# Patient Record
Sex: Female | Born: 1990 | Race: White | Hispanic: No | Marital: Married | Smoking: Never smoker
Health system: Southern US, Community
[De-identification: ages and names within clinical notes are randomized; demographics above are authoritative.]

## PROBLEM LIST (undated history)

## (undated) DIAGNOSIS — I493 Ventricular premature depolarization: Secondary | ICD-10-CM

## (undated) HISTORY — PX: WISDOM TOOTH EXTRACTION: SHX21

## (undated) HISTORY — DX: Ventricular premature depolarization: I49.3

---
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
## 2010-05-26 NOTE — Assessment & Plan Note (Signed)
Summary: ? 2nd step TB SHOT- jr  Nurse Visit   Allergies: No Known Drug Allergies  Immunizations Administered:  PPD Skin Test:    Vaccine Type: PPD    Site: right forearm    Mfr: Sanofi Pasteur    Dose: 0.1 ml    Route: ID    Given by: Kathlene November    Exp. Date: 02/06/2011    Lot #: [id number]  Orders Added: 1)  TB Skin Test [86580] 2)  Admin 1st Vaccine [90471]   Appended Document: ? 2nd step TB SHOT- jr   PPD Results    Date of reading: 10/16/2009    Results: < 5mm    Interpretation: negative

## 2010-05-26 NOTE — Miscellaneous (Signed)
Summary: TB SCREENING  TB SCREENING   Imported By: Shelbie Proctor 10/09/2009 16:30:02  _____________________________________________________________________  External Attachment:    Type:   Image     Comment:   External Document

## 2010-07-03 ENCOUNTER — Telehealth: Payer: Self-pay | Admitting: Family Medicine

## 2010-07-07 ENCOUNTER — Ambulatory Visit: Payer: Self-pay | Admitting: Family Medicine

## 2010-07-07 ENCOUNTER — Ambulatory Visit (INDEPENDENT_AMBULATORY_CARE_PROVIDER_SITE_OTHER): Payer: BC Managed Care – PPO | Admitting: Family Medicine

## 2010-07-07 ENCOUNTER — Encounter: Payer: Self-pay | Admitting: Family Medicine

## 2010-07-07 DIAGNOSIS — Z3009 Encounter for other general counseling and advice on contraception: Secondary | ICD-10-CM

## 2010-07-07 NOTE — Progress Notes (Signed)
Summary: ? about visit next week  Phone Note Call from Patient Call back at Home Phone [phone]   Caller: Mom Reason for Call: Talk to Nurse Summary of Call: Pt will be seen on 3/12 for check up to start birth control pills.  Mom would like to know what this visit will entail-?pap and pelvic.  Dr. Linford Arnold, what is your usual procedure for pt's this age.  I do not know if she is sexually active. Initial call taken by: Francee Piccolo CMA Duncan Dull),  July 03, 2010 10:53 AM  Follow-up for Phone Call        WE don't start pap until age 23.  Follow-up by: Nani Gasser MD,  July 03, 2010 1:07 PM  Additional Follow-up for Phone Call Additional follow up Details #1::        Advised pt mom we will not be doing pap, pelvic or breast exam unless Mireille is having problems.  Mom agreeable. Additional Follow-up by: Francee Piccolo CMA (AAMA),  July 03, 2010 1:25 PM

## 2010-07-14 NOTE — Assessment & Plan Note (Signed)
Summary: Discuss Birth Control - jr   Vital Signs:  Patient profile:   20 year old female Height:      66 inches Weight:      121 pounds Pulse rate:   80 / minute BP sitting:   134 / 83  (right arm) Cuff size:   regular  Vitals Entered By: Avon Gully CMA, Duncan Dull) (July 07, 2010 2:01 PM) CC: discuss birth control   Primary Care Provider:  Nani Gasser MD  CC:  discuss birth control.  History of Present Illness:   patient is here today because she plans on becoming sexually active. She is not currently sexually active but she would like to discuss birth control options. She is here with her mother today.  Current Medications (verified): 1)  None  Allergies (verified): No Known Drug Allergies  Comments:  Nurse/Medical Assistant: The patient's medications and allergies were reviewed with the patient and were updated in the Medication and Allergy Lists. Avon Gully CMA, Duncan Dull) (July 07, 2010 2:02 PM)  Physical Exam  General:  Well-developed,well-nourished,in no acute distress; alert,appropriate and cooperative throughout examination Head:  Normocephalic and atraumatic without obvious abnormalities. No apparent alopecia or balding. Lungs:  Normal respiratory effort, chest expands symmetrically. Lungs are clear to auscultation, no crackles or wheezes. Heart:  Normal rate and regular rhythm. S1 and S2 normal without gallop, murmur, click, rub or other extra sounds.   Impression & Recommendations:  Problem # 1:  CONTRACEPTIVE MANAGEMENT (ICD-V25.09)  we spent 20 minutes discussing different options. After thorough discussion she decided on oral contraceptive pills. We discussed the pros and cons and Darral Dash to monitor for any signs of DVT. We will follow her up in one to 2 months to recheck her blood pressure on medication. we also discussed what to do when she misses a pill. I also reviewed the importance of using condoms to prevent STDs and that she should get  tested for gonorrhea and Chlamydia yearly even though we don't start Pap smears until age 36.  Complete Medication List: 1)  Yaz 3-0.02 Mg Tabs (Drospirenone-ethinyl estradiol) .... Take 1 tablet by mouth once a day  Patient Instructions: 1)  Please schedule a follow-up appointment in 1-2  months for blood pressure..  Prescriptions: YAZ 3-0.02 MG TABS (DROSPIRENONE-ETHINYL ESTRADIOL) Take 1 tablet by mouth once a day  #30 x 1   Entered and Authorized by:   Nani Gasser MD   Signed by:   Nani Gasser MD on 07/07/2010   Method used:   Electronically to        [facility] [phone]* (retail)       [address] [place], [place]  [address]       Ph: [phone] or [phone]       Fax: [phone]   RxID:   [id number]    Orders Added: 1)  Est. Patient Level III [36644]

## 2010-08-31 ENCOUNTER — Other Ambulatory Visit: Payer: Self-pay | Admitting: Family Medicine

## 2010-09-07 ENCOUNTER — Encounter: Payer: Self-pay | Admitting: Family Medicine

## 2010-09-08 ENCOUNTER — Ambulatory Visit (INDEPENDENT_AMBULATORY_CARE_PROVIDER_SITE_OTHER): Payer: BC Managed Care – PPO | Admitting: Family Medicine

## 2010-09-08 ENCOUNTER — Encounter: Payer: Self-pay | Admitting: Family Medicine

## 2010-09-08 DIAGNOSIS — Z3009 Encounter for other general counseling and advice on contraception: Secondary | ICD-10-CM

## 2010-09-08 NOTE — Progress Notes (Signed)
  Subjective:    Patient ID: Betty Larson, female    DOB: 09-09-1990, 20 y.o.   MRN: [id number]  HPI Doing well on her OCP.   NO SE She wants to stay with it. No spotting. BP well controlled.  She is sexually active but has been using condoms as well.    Review of Systems     Objective:   Physical Exam  Constitutional: She appears well-developed and well-nourished.  HENT:  Head: Normocephalic and atraumatic.  Cardiovascular: Normal rate, regular rhythm and normal heart sounds.   Pulmonary/Chest: Effort normal and breath sounds normal.          Assessment & Plan:  Contraceptive counseling. She is doing well. Happy with her regimen.  She has refills for the rest of the year. F/U at age 44 for a pap smear.

## 2010-10-20 ENCOUNTER — Other Ambulatory Visit: Payer: Self-pay | Admitting: Family Medicine

## 2010-12-15 ENCOUNTER — Other Ambulatory Visit: Payer: Self-pay | Admitting: Family Medicine

## 2011-02-05 ENCOUNTER — Other Ambulatory Visit: Payer: Self-pay | Admitting: Family Medicine

## 2011-03-29 ENCOUNTER — Other Ambulatory Visit: Payer: Self-pay | Admitting: Family Medicine

## 2011-04-10 ENCOUNTER — Emergency Department
Admission: EM | Admit: 2011-04-10 | Discharge: 2011-04-10 | Disposition: A | Discharge status: Home / Self Care | Payer: BC Managed Care – PPO | Source: Home / Self Care | Attending: Emergency Medicine | Admitting: Emergency Medicine

## 2011-04-10 DIAGNOSIS — J069 Acute upper respiratory infection, unspecified: Secondary | ICD-10-CM

## 2011-04-10 DIAGNOSIS — J029 Acute pharyngitis, unspecified: Secondary | ICD-10-CM

## 2011-04-10 LAB — POCT RAPID STREP A (OFFICE): Rapid Strep A Screen: NEGATIVE

## 2011-04-10 MED ORDER — OSELTAMIVIR PHOSPHATE 75 MG PO CAPS: 75.0000 mg | ORAL_CAPSULE | Freq: Two times a day (BID) | ORAL | Status: AC

## 2011-04-10 MED ORDER — MAGIC MOUTHWASH W/LIDOCAINE: 5.0000 mL | Freq: Four times a day (QID) | ORAL | Status: DC | PRN

## 2011-04-10 NOTE — ED Provider Notes (Signed)
History     CSN: [id number] Arrival date & time: No admission date for patient encounter.   First MD Initiated Contact with Patient 04/10/11 1223      No chief complaint on file.   (Consider location/radiation/quality/duration/timing/severity/associated sxs/prior treatment) HPI Betty Larson is a 20 y.o. female who complains of onset of cold symptoms for 1 days.  + sore throat + cough No pleuritic pain No wheezing + nasal congestion + post-nasal drainage + sinus pain/pressure No chest congestion No itchy/red eyes No earache No hemoptysis No SOB + chills/sweats + fever + nausea No vomiting No abdominal pain No diarrhea No skin rashes + fatigue + myalgias + headache    No past medical history on file.  Past Surgical History  Procedure Date  . Wisdom tooth extraction     Family History  Problem Relation Age of Onset  . Cancer Maternal Grandmother     breast  . Diabetes Maternal Grandfather     History  Substance Use Topics  . Smoking status: Never Smoker   . Smokeless tobacco: Not on file  . Alcohol Use: No    OB History    Grav Para Term Preterm Abortions TAB SAB Ect Mult Living                  Review of Systems  Allergies  Review of patient's allergies indicates no known allergies.  Home Medications   Current Outpatient Rx  Name Route Sig Dispense Refill  . GIANVI 3-0.02 MG PO TABS  TAKE 1 TABLET BY MOUTH ONCE A DAY 28 tablet 1    There were no vitals taken for this visit.  Physical Exam  Nursing note and vitals reviewed. Constitutional: She is oriented to person, place, and time. She appears well-developed and well-nourished.  HENT:  Head: Normocephalic and atraumatic.  Right Ear: Tympanic membrane, external ear and ear canal normal.  Left Ear: Tympanic membrane, external ear and ear canal normal.  Nose: Mucosal edema and rhinorrhea present.  Mouth/Throat: Posterior oropharyngeal edema (mild tonsillar swelling but no exudate) and  posterior oropharyngeal erythema present.  Eyes: No scleral icterus.  Neck: Neck supple.  Cardiovascular: Regular rhythm and normal heart sounds.   Pulmonary/Chest: Effort normal and breath sounds normal. No respiratory distress.  Neurological: She is alert and oriented to person, place, and time.  Skin: Skin is warm and dry.  Psychiatric: She has a normal mood and affect. Her speech is normal.    ED Course  Procedures (including critical care time)  Labs Reviewed - No data to display No results found.   No diagnosis found.    MDM  1)  the rapid strep test is negative. A throat culture is pending. I feel the most likely diagnosis is influenza. 2)  Use nasal saline solution (over the counter) at least 3 times a day. 3)  Use over the counter decongestants like Zyrtec-D every 12 hours as needed to help with congestion.  If you have hypertension, do not take medicines with sudafed.  4)  Can take tylenol every 6 hours or motrin every 8 hours for pain or fever. 5)  Follow up with your primary doctor if no improvement in 5-7 days, sooner if increasing pain, fever, or new symptoms.       Lily Kocher, MD 04/10/11 [phone]

## 2011-04-10 NOTE — ED Notes (Signed)
Symptoms started yesterday.

## 2011-04-11 LAB — STREP A DNA PROBE: GASP: NEGATIVE

## 2011-05-20 ENCOUNTER — Other Ambulatory Visit: Payer: Self-pay | Admitting: Family Medicine

## 2011-07-11 ENCOUNTER — Other Ambulatory Visit: Payer: Self-pay | Admitting: Family Medicine

## 2011-07-28 ENCOUNTER — Ambulatory Visit (INDEPENDENT_AMBULATORY_CARE_PROVIDER_SITE_OTHER): Payer: BC Managed Care – PPO | Admitting: Family Medicine

## 2011-07-28 ENCOUNTER — Encounter: Payer: Self-pay | Admitting: Family Medicine

## 2011-07-28 VITALS — BP 121/72 | HR 73 | Ht 66.0 in | Wt 127.0 lb

## 2011-07-28 DIAGNOSIS — N390 Urinary tract infection, site not specified: Secondary | ICD-10-CM

## 2011-07-28 LAB — POCT URINALYSIS DIPSTICK
Bilirubin, UA: NEGATIVE
Glucose, UA: NEGATIVE
Ketones, UA: NEGATIVE
Nitrite, UA: NEGATIVE
Spec Grav, UA: 1.025

## 2011-07-28 MED ORDER — CIPROFLOXACIN HCL 500 MG PO TABS: 500.0000 mg | ORAL_TABLET | Freq: Two times a day (BID) | ORAL | Status: AC

## 2011-07-28 MED ORDER — CIPROFLOXACIN HCL 500 MG PO TABS: 500.0000 mg | ORAL_TABLET | Freq: Two times a day (BID) | ORAL | Status: DC

## 2011-07-28 NOTE — Patient Instructions (Signed)

## 2011-07-28 NOTE — Progress Notes (Signed)
  Subjective:    Patient ID: Betty Larson, female    DOB: 08/01/1990, 21 y.o.   MRN: [id number]  HPI Had a UTI about 2 months ago.  Starting urgency and frequency yesterday.  No fever or back pain.  No nausea.  Did an at home test that was borderin.    Review of Systems     Objective:   Physical Exam  Constitutional: She appears well-developed and well-nourished.  Musculoskeletal:       No CVA tenderenss. No abdominal pain or tenderness.   Skin: Skin is warm and dry.  Psychiatric: She has a normal mood and affect. Her behavior is normal.          Assessment & Plan:  UTI - Will tx with cipro. Call if not better in 4-5 days.

## 2011-08-13 ENCOUNTER — Encounter: Payer: Self-pay | Admitting: Family Medicine

## 2011-08-13 ENCOUNTER — Ambulatory Visit (INDEPENDENT_AMBULATORY_CARE_PROVIDER_SITE_OTHER): Payer: BC Managed Care – PPO | Admitting: Family Medicine

## 2011-08-13 VITALS — BP 135/79 | HR 103 | Temp 98.1°F | Ht 66.0 in | Wt 127.0 lb

## 2011-08-13 DIAGNOSIS — N390 Urinary tract infection, site not specified: Secondary | ICD-10-CM

## 2011-08-13 DIAGNOSIS — R350 Frequency of micturition: Secondary | ICD-10-CM

## 2011-08-13 LAB — POCT URINALYSIS DIPSTICK
Bilirubin, UA: NEGATIVE
Glucose, UA: NEGATIVE
Ketones, UA: NEGATIVE
Spec Grav, UA: 1.015
Urobilinogen, UA: 0.2

## 2011-08-13 MED ORDER — SULFAMETHOXAZOLE-TRIMETHOPRIM 800-160 MG PO TABS: 1.0000 | ORAL_TABLET | Freq: Two times a day (BID) | ORAL | Status: DC

## 2011-08-13 NOTE — Patient Instructions (Signed)

## 2011-08-13 NOTE — Progress Notes (Signed)
  Subjective:    Patient ID: Betty Larson, female    DOB: 1990-09-21, 21 y.o.   MRN: [id number]  HPI Urinary freq x 3 days. No hematuria or dysuria.  No fever or back pain.  Ws tx with cipro about 2 weeks ago for UTI. Says has been drinking plenty of watr and trying to not "hold" her urine to try to prevent UTI.    Review of Systems     Objective:   Physical Exam  Constitutional: She is oriented to person, place, and time. She appears well-developed and well-nourished.  HENT:  Head: Normocephalic and atraumatic.  Neurological: She is alert and oriented to person, place, and time.  Psychiatric: She has a normal mood and affect. Her behavior is normal.          Assessment & Plan:  UA- Will send for culture to confirm and to make sure no resistance. Will tx with BActrim DS bid x 3 days.

## 2011-08-15 LAB — URINE CULTURE: Colony Count: >=100000

## 2011-08-16 ENCOUNTER — Other Ambulatory Visit: Payer: Self-pay | Admitting: Family Medicine

## 2011-08-16 MED ORDER — FLUCONAZOLE 150 MG PO TABS: 150.0000 mg | ORAL_TABLET | Freq: Once | ORAL | Status: AC

## 2011-08-19 ENCOUNTER — Ambulatory Visit (INDEPENDENT_AMBULATORY_CARE_PROVIDER_SITE_OTHER): Payer: BC Managed Care – PPO | Admitting: Family Medicine

## 2011-08-19 ENCOUNTER — Encounter: Payer: Self-pay | Admitting: Family Medicine

## 2011-08-19 VITALS — BP 128/85 | HR 88 | Temp 97.5°F | Ht 66.0 in | Wt 127.0 lb

## 2011-08-19 DIAGNOSIS — R300 Dysuria: Secondary | ICD-10-CM | POA: Insufficient documentation

## 2011-08-19 DIAGNOSIS — R350 Frequency of micturition: Secondary | ICD-10-CM

## 2011-08-19 LAB — POCT URINALYSIS DIPSTICK
Blood, UA: NEGATIVE
Protein, UA: NEGATIVE
Spec Grav, UA: >=1.03
Urobilinogen, UA: 0.2

## 2011-08-19 NOTE — Patient Instructions (Signed)
We will call you with the urine culture results. 

## 2011-08-19 NOTE — Progress Notes (Signed)
  Subjective:    Patient ID: Dany Harten, female    DOB: 1990/08/12, 21 y.o.   MRN: [id number]  HPI Still having urinary frequency, but no dysuria. Thought says when wokeup this Am felt much better.   No hematuria.  No fever or back pain. Marland Kitchen  Her UA at last visit was + for yeast so tx'd with diflucan. Also tx with Bactrim, completed ABX.  She is sexually active she uses condoms and she is on birth control.mother is here with her today. She plans on scheudling a pap smear next month when she turns 21.   Review of Systems     Objective:   Physical Exam  Constitutional: She is oriented to person, place, and time. She appears well-developed and well-nourished.  HENT:  Head: Normocephalic and atraumatic.  Neurological: She is alert and oriented to person, place, and time.  Skin: Skin is warm and dry.  Psychiatric: She has a normal mood and affect. Her behavior is normal.          Assessment & Plan:  Urinary frequency. Sxs resolved today. UA is neg. Will send for repeat culture.  If neg, and sxs persist then will refer to Urology for further eval. If sxs resolve and cutlure is neg then will follow. Will add urine Gc/chlam testing as well to urine sample (thought tech a clean catch).

## 2011-08-20 LAB — GC/CHLAMYDIA PROBE AMP, URINE: GC Probe Amp, Urine: NEGATIVE

## 2011-08-21 LAB — URINE CULTURE: Colony Count: NO GROWTH

## 2011-09-06 ENCOUNTER — Telehealth: Payer: Self-pay | Admitting: *Deleted

## 2011-09-06 DIAGNOSIS — R300 Dysuria: Secondary | ICD-10-CM

## 2011-09-06 NOTE — Telephone Encounter (Signed)
Patient can schedule an appointment to come in for a urinalysis with the nurse. I will go ahead and put in a referral for urology but will probably be about a week before can get her in.

## 2011-09-06 NOTE — Telephone Encounter (Signed)
Pt called and is still having urinary sxs. Needs referral to urologist per last progress note

## 2011-09-07 ENCOUNTER — Ambulatory Visit (INDEPENDENT_AMBULATORY_CARE_PROVIDER_SITE_OTHER): Payer: BC Managed Care – PPO | Admitting: Family Medicine

## 2011-09-07 VITALS — BP 122/78 | HR 81

## 2011-09-07 DIAGNOSIS — R300 Dysuria: Secondary | ICD-10-CM

## 2011-09-07 LAB — POCT URINALYSIS DIPSTICK
Bilirubin, UA: NEGATIVE
Leukocytes, UA: NEGATIVE
Nitrite, UA: NEGATIVE
Protein, UA: NEGATIVE
Urobilinogen, UA: 0.2
pH, UA: 6.5

## 2011-09-07 NOTE — Progress Notes (Signed)
  Subjective:    Patient ID: Betty Larson, female    DOB: Sep 11, 1990, 21 y.o.   MRN: [id number] Urinalysis and Cx; Urology appt 09/15/11 HPI    Review of Systems     Objective:   Physical Exam        Assessment & Plan:  Urinary frequency-patient called yesterday and said she was still having symptoms. She does not have an appointment with urology but it's not for next week. Urinalysis was negative but we will send it for culture just to confirm. I recommend keeping the appointment with urology. Consider that she could have another diagnosis such as interstitial cystitis. Cipriano Bunker, MD

## 2011-09-07 NOTE — Telephone Encounter (Signed)
Pt.notified

## 2011-09-09 LAB — URINE CULTURE

## 2011-10-05 ENCOUNTER — Encounter: Payer: Self-pay | Admitting: Obstetrics & Gynecology

## 2011-10-05 ENCOUNTER — Ambulatory Visit (INDEPENDENT_AMBULATORY_CARE_PROVIDER_SITE_OTHER): Payer: BC Managed Care – PPO | Admitting: Obstetrics & Gynecology

## 2011-10-05 VITALS — BP 121/76 | HR 77 | Temp 98.6°F | Resp 16 | Ht 66.0 in | Wt 125.0 lb

## 2011-10-05 DIAGNOSIS — Z124 Encounter for screening for malignant neoplasm of cervix: Secondary | ICD-10-CM

## 2011-10-05 DIAGNOSIS — Z113 Encounter for screening for infections with a predominantly sexual mode of transmission: Secondary | ICD-10-CM

## 2011-10-05 DIAGNOSIS — Z0000 Encounter for general adult medical examination without abnormal findings: Secondary | ICD-10-CM

## 2011-10-05 NOTE — Progress Notes (Signed)
Subjective:    Betty Larson is a 21 y.o. female who presents for an annual exam. The patient has no complaints today. The patient is sexually active. GYN screening history: no prior history of gyn screening tests. The patient wears seatbelts: yes. The patient participates in regular exercise: yes. Has the patient ever been transfused or tattooed?: no. The patient reports that there is not domestic violence in her life.   Menstrual History: OB History    Grav Para Term Preterm Abortions TAB SAB Ect Mult Living                  Menarche age: 61 Patient's last menstrual period was 09/27/2011.    The following portions of the patient's history were reviewed and updated as appropriate: allergies, current medications, past family history, past medical history, past social history, past surgical history and problem list.  Review of Systems A comprehensive review of systems was negative. She has had 1 partner in her lifetime, for the last 1 1/2 years. 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 She denies dysparunia. She tells me that she's had her Gardasil series at about age 2.   Objective:    BP 121/76  Pulse 77  Temp(Src) 98.6 F (37 C) (Oral)  Resp 16  Ht 5\' 6"  (1.676 m)  Wt 56.7 kg (125 lb)  BMI 20.18 kg/m2  LMP 09/27/2011  General Appearance:    Alert, cooperative, no distress, appears stated age  Head:    Normocephalic, without obvious abnormality, atraumatic  Eyes:    PERRL, conjunctiva/corneas clear, EOM's intact, fundi    benign, both eyes  Ears:    Normal TM's and external ear canals, both ears  Nose:   Nares normal, septum midline, mucosa normal, no drainage    or sinus tenderness  Throat:   Lips, mucosa, and tongue normal; teeth and gums normal  Neck:   Supple, symmetrical, trachea midline, no adenopathy;    thyroid:  no enlargement/tenderness/nodules; no carotid   bruit or JVD  Back:     Symmetric, no curvature, ROM normal, no CVA tenderness  Lungs:      Clear to auscultation bilaterally, respirations unlabored  Chest Wall:    No tenderness or deformity   Heart:    Regular rate and rhythm, S1 and S2 normal, no murmur, rub   or gallop  Breast Exam:    No tenderness, masses, or nipple abnormality  Abdomen:     Soft, non-tender, bowel sounds active all four quadrants,    no masses, no organomegaly  Genitalia:    Normal female without lesion, discharge or tenderness, NSSA, NT, no adnexal masses     Extremities:   Extremities normal, atraumatic, no cyanosis or edema  Pulses:   2+ and symmetric all extremities  Skin:   Skin color, texture, turgor normal, no rashes or lesions  Lymph nodes:   Cervical, supraclavicular, and axillary nodes normal  Neurologic:   CNII-XII intact, normal strength, sensation and reflexes    throughout  .    Assessment:    Healthy female exam.    Plan:     Pap smear.

## 2011-11-02 ENCOUNTER — Ambulatory Visit (INDEPENDENT_AMBULATORY_CARE_PROVIDER_SITE_OTHER): Payer: BC Managed Care – PPO | Admitting: Physician Assistant

## 2011-11-02 ENCOUNTER — Encounter: Payer: Self-pay | Admitting: Physician Assistant

## 2011-11-02 VITALS — BP 125/79 | HR 79 | Ht 66.0 in | Wt 125.0 lb

## 2011-11-02 DIAGNOSIS — R300 Dysuria: Secondary | ICD-10-CM

## 2011-11-02 DIAGNOSIS — N390 Urinary tract infection, site not specified: Secondary | ICD-10-CM

## 2011-11-02 LAB — POCT URINALYSIS DIPSTICK
Bilirubin, UA: NEGATIVE
Glucose, UA: NEGATIVE
Ketones, UA: NEGATIVE
Spec Grav, UA: 1.02

## 2011-11-02 MED ORDER — CIPROFLOXACIN HCL 500 MG PO TABS: 500.0000 mg | ORAL_TABLET | Freq: Two times a day (BID) | ORAL | Status: DC

## 2011-11-02 NOTE — Progress Notes (Signed)
  Subjective:    Patient ID: Betty Larson, female    DOB: 1991-02-20, 21 y.o.   MRN: [id number]  HPI Pt has had painful urination and lower abdominal pressure/discomfort for 3 days. Has a hx of UTI and overactive bladder. She believes this feels different than OAB. Denies fever, blood, discharge. Has had some nausea but no vomiting or back pain. Is on Vesicare for OAB and is helping a lot.     Review of Systems     Objective:   Physical Exam  Constitutional: She is oriented to person, place, and time. She appears well-developed and well-nourished.  HENT:  Head: Normocephalic and atraumatic.  Eyes: Conjunctivae are normal.  Cardiovascular: Normal rate, regular rhythm and normal heart sounds.   Pulmonary/Chest: Effort normal and breath sounds normal. She has no wheezes.       No CVA tenderness.   Abdominal: Soft. Bowel sounds are normal. There is no tenderness.  Neurological: She is alert and oriented to person, place, and time.  Skin: Skin is warm and dry.  Psychiatric: She has a normal mood and affect. Her behavior is normal.          Assessment & Plan:  UTI- UA is + for blood and leuks. Will still send for culture. Treated with Cipro for 3 days. Pt was instructed to call if not improving.

## 2011-11-02 NOTE — Patient Instructions (Signed)
Cipro was given for 3 days to take twice a day. Will call with culture results.   Urinary Tract Infection Infections of the urinary tract can start in several places. A bladder infection (cystitis), a kidney infection (pyelonephritis), and a prostate infection (prostatitis) are different types of urinary tract infections (UTIs). They usually get better if treated with medicines (antibiotics) that kill germs. Take all the medicine until it is gone. You or your child may feel better in a few days, but TAKE ALL MEDICINE or the infection may not respond and may become more difficult to treat. HOME CARE INSTRUCTIONS   Drink enough water and fluids to keep the urine clear or pale yellow. Cranberry juice is especially recommended, in addition to large amounts of water.   Avoid caffeine, tea, and carbonated beverages. They tend to irritate the bladder.   Alcohol may irritate the prostate.   Only take over-the-counter or prescription medicines for pain, discomfort, or fever as directed by your caregiver.  To prevent further infections:  Empty the bladder often. Avoid holding urine for long periods of time.   After a bowel movement, women should cleanse from front to back. Use each tissue only once.   Empty the bladder before and after sexual intercourse.  FINDING OUT THE RESULTS OF YOUR TEST Not all test results are available during your visit. If your or your child's test results are not back during the visit, make an appointment with your caregiver to find out the results. Do not assume everything is normal if you have not heard from your caregiver or the medical facility. It is important for you to follow up on all test results. SEEK MEDICAL CARE IF:   There is back pain.   Your baby is older than 3 months with a rectal temperature of 100.5 F (38.1 C) or higher for more than 1 day.   Your or your child's problems (symptoms) are no better in 3 days. Return sooner if you or your child is  getting worse.  SEEK IMMEDIATE MEDICAL CARE IF:   There is severe back pain or lower abdominal pain.   You or your child develops chills.   You have a fever.   Your baby is older than 3 months with a rectal temperature of 102 F (38.9 C) or higher.   Your baby is 40 months old or younger with a rectal temperature of 100.4 F (38 C) or higher.   There is nausea or vomiting.   There is continued burning or discomfort with urination.  MAKE SURE YOU:   Understand these instructions.   Will watch your condition.   Will get help right away if you are not doing well or get worse.  Document Released: 01/20/2005 Document Revised: 04/01/2011 Document Reviewed: 08/25/2006 [facility] Patient Information 2012 [place], [place].

## 2011-11-06 LAB — URINE CULTURE: Colony Count: >=100000

## 2011-11-08 MED ORDER — CIPROFLOXACIN HCL 500 MG PO TABS: 500.0000 mg | ORAL_TABLET | Freq: Two times a day (BID) | ORAL | Status: AC

## 2011-11-08 NOTE — Addendum Note (Signed)
Addended by: Ellsworth Lennox on: 11/08/2011 01:27 PM   Modules accepted: Orders

## 2011-12-14 ENCOUNTER — Other Ambulatory Visit: Payer: Self-pay | Admitting: Family Medicine

## 2012-06-06 ENCOUNTER — Encounter: Payer: Self-pay | Admitting: *Deleted

## 2012-06-06 ENCOUNTER — Emergency Department
Admission: EM | Admit: 2012-06-06 | Discharge: 2012-06-06 | Disposition: A | Discharge status: Home / Self Care | Payer: Self-pay | Source: Home / Self Care | Attending: Family Medicine | Admitting: Family Medicine

## 2012-06-06 DIAGNOSIS — L732 Hidradenitis suppurativa: Secondary | ICD-10-CM

## 2012-06-06 MED ORDER — DOXYCYCLINE HYCLATE 100 MG PO CAPS: 100.0000 mg | ORAL_CAPSULE | Freq: Two times a day (BID) | ORAL | Status: AC

## 2012-06-06 NOTE — ED Provider Notes (Signed)
History     CSN: [id number]  Arrival date & time 06/06/12  1433   First MD Initiated Contact with Patient 06/06/12 1439      Chief Complaint  Patient presents with  . Wound Infection    right axilla   HPI  HPI  This patient complains of a RASH  Location: R axilla   Onset: 2 days   Course: Has had some redness and drainage. No fever or chills. Shaved axilla earlier in the week.   Self-treated with: warm compresses  Improvement with treatment: mild-moderate  History  Itching: no  Tenderness: yes  New medications/antibiotics: no  Pet exposure: no  Recent travel or tropical exposure: no  New soaps, shampoos, detergent, clothing: no  Tick/insect exposure: no  Chemical Exposure: no  Red Flags  Feeling ill: no  Fever: no  Facial/tongue swelling/difficulty breathing: no  Diabetic or immunocompromised: no    History reviewed. No pertinent past medical history.  Past Surgical History  Procedure Laterality Date  . Wisdom tooth extraction      Family History  Problem Relation Age of Onset  . Cancer Maternal Grandmother     breast  . Diabetes Maternal Grandfather     History  Substance Use Topics  . Smoking status: Never Smoker   . Smokeless tobacco: Never Used  . Alcohol Use: No    OB History   Grav Para Term Preterm Abortions TAB SAB Ect Mult Living                  Review of Systems  All other systems reviewed and are negative.    Allergies  Review of patient's allergies indicates no known allergies.  Home Medications   Current Outpatient Rx  Name  Route  Sig  Dispense  Refill  . doxycycline (VIBRAMYCIN) 100 MG capsule   Oral   Take 1 capsule (100 mg total) by mouth 2 (two) times daily.   14 capsule   0   . GIANVI 3-0.02 MG tablet      TAKE 1 TABLET BY MOUTH ONCE A DAY   28 tablet   5   . solifenacin (VESICARE) 5 MG tablet   Oral   Take 5 mg by mouth.           BP 117/79  Pulse 73  Temp(Src) 98 F (36.7 C) (Oral)  Ht 5\' 7"   (1.702 m)  Wt 126 lb (57.153 kg)  BMI 19.73 kg/m2  SpO2 99%  Physical Exam  Constitutional: She appears well-developed and well-nourished.  HENT:  Head: Normocephalic and atraumatic.  Eyes: Conjunctivae are normal. Pupils are equal, round, and reactive to light.  Neck: Normal range of motion. Neck supple.  Cardiovascular: Normal rate, regular rhythm and normal heart sounds.   Pulmonary/Chest: Effort normal and breath sounds normal.    R axilla soft tissue swelling around 1x1 cm    Neurological: She is alert.  Skin: Rash noted.    ED Course  Procedures (including critical care time)  Labs Reviewed - No data to display No results found.   1. Hidradenitis axillaris       MDM  Will treat with doxycycline.  Discussed general care and infectious red flags.  Follow up as needed.    The patient and/or caregiver has been counseled thoroughly with regard to treatment plan and/or medications prescribed including dosage, schedule, interactions, rationale for use, and possible side effects and they verbalize understanding. Diagnoses and expected course of recovery discussed and will  return if not improved as expected or if the condition worsens. Patient and/or caregiver verbalized understanding.             Doree Albee, MD 06/06/12 [phone]

## 2012-06-06 NOTE — ED Notes (Signed)
Patient presents with infection to right axilla x 1 week. Site has drained itself x2. Denies any fever or chills.

## 2012-06-23 ENCOUNTER — Other Ambulatory Visit: Payer: Self-pay | Admitting: Family Medicine

## 2012-08-18 ENCOUNTER — Other Ambulatory Visit: Payer: Self-pay | Admitting: Family Medicine

## 2012-08-18 NOTE — Telephone Encounter (Signed)
This rx should be filled by Dr Nicholaus Bloom

## 2012-09-26 ENCOUNTER — Other Ambulatory Visit: Payer: Self-pay | Admitting: Family Medicine

## 2012-10-19 ENCOUNTER — Other Ambulatory Visit: Payer: Self-pay | Admitting: Family Medicine

## 2012-11-17 ENCOUNTER — Other Ambulatory Visit: Payer: Self-pay

## 2012-11-20 ENCOUNTER — Telehealth: Payer: Self-pay

## 2012-11-20 MED ORDER — DROSPIRENONE-ETHINYL ESTRADIOL 3-0.02 MG PO TABS: 1.0000 | ORAL_TABLET | Freq: Every day | ORAL | Status: DC

## 2012-11-20 NOTE — Telephone Encounter (Signed)
Scheduled patient for CPE and refilled medication for 1 month.

## 2012-11-23 ENCOUNTER — Encounter: Payer: Self-pay | Admitting: Family Medicine

## 2012-11-23 ENCOUNTER — Ambulatory Visit (INDEPENDENT_AMBULATORY_CARE_PROVIDER_SITE_OTHER): Payer: BC Managed Care – PPO | Admitting: Family Medicine

## 2012-11-23 ENCOUNTER — Other Ambulatory Visit (HOSPITAL_COMMUNITY)
Admission: RE | Admit: 2012-11-23 | Discharge: 2012-11-23 | Disposition: A | Discharge status: Home / Self Care | Payer: BC Managed Care – PPO | Source: Ambulatory Visit | Attending: Family Medicine | Admitting: Family Medicine

## 2012-11-23 VITALS — BP 114/67 | HR 91 | Ht 67.0 in | Wt 122.0 lb

## 2012-11-23 DIAGNOSIS — Z113 Encounter for screening for infections with a predominantly sexual mode of transmission: Secondary | ICD-10-CM | POA: Insufficient documentation

## 2012-11-23 DIAGNOSIS — Z01419 Encounter for gynecological examination (general) (routine) without abnormal findings: Secondary | ICD-10-CM

## 2012-11-23 DIAGNOSIS — Z124 Encounter for screening for malignant neoplasm of cervix: Secondary | ICD-10-CM

## 2012-11-23 DIAGNOSIS — Z0000 Encounter for general adult medical examination without abnormal findings: Secondary | ICD-10-CM

## 2012-11-23 MED ORDER — DROSPIRENONE-ETHINYL ESTRADIOL 3-0.02 MG PO TABS: 1.0000 | ORAL_TABLET | Freq: Every day | ORAL | Status: DC

## 2012-11-23 NOTE — Patient Instructions (Addendum)
Keep up a regular exercise program and make sure you are eating a healthy diet Try to eat 4 servings of dairy a day, or if you are lactose intolerant take a calcium with vitamin D daily.  Your vaccines are up to date.   

## 2012-11-23 NOTE — Progress Notes (Signed)
Subjective:    Patient ID: Betty Larson, female    DOB: 1990/11/19, 22 y.o.   MRN: [id number]  HPI Here for CP w/ Pap.  She is currently on birth control. She started her period on Sunday but has not bled today.  She is sexually active. No pelvic pain and no discharge.  She needs refills.    Review of Systems Comprehensive review of systems is negative.  BP 114/67  Pulse 91  Ht 5\' 7"  (1.702 m)  Wt 122 lb (55.339 kg)  BMI 19.1 kg/m2    No Known Allergies  No past medical history on file.  Past Surgical History  Procedure Laterality Date  . Wisdom tooth extraction      History   Social History  . Marital Status: Single    Spouse Name: N/A    Number of Children: N/A  . Years of Education: N/A   Occupational History  . Nanny    Social History Main Topics  . Smoking status: Never Smoker   . Smokeless tobacco: Never Used  . Alcohol Use: No  . Drug Use: No  . Sexually Active: Yes -- Female partner(s)   Other Topics Concern  . Not on file   Social History Narrative   No regular exercise.     Family History  Problem Relation Age of Onset  . Breast cancer Maternal Grandmother     breast  . Diabetes Maternal Grandfather     Outpatient Encounter Prescriptions as of 11/23/2012  Medication Sig Dispense Refill  . drospirenone-ethinyl estradiol (LORYNA) 3-0.02 MG tablet Take 1 tablet by mouth daily.  28 tablet  11  . [DISCONTINUED] drospirenone-ethinyl estradiol (LORYNA) 3-0.02 MG tablet Take 1 tablet by mouth daily.  28 tablet  0  . [DISCONTINUED] solifenacin (VESICARE) 5 MG tablet Take 5 mg by mouth.       No facility-administered encounter medications on file as of 11/23/2012.          Objective:   Physical Exam  Constitutional: She is oriented to person, place, and time. She appears well-developed and well-nourished.  HENT:  Head: Normocephalic and atraumatic.  Right Ear: External ear normal.  Left Ear: External ear normal.  Nose: Nose normal.   Mouth/Throat: Oropharynx is clear and moist.  TMs and canals are clear.   Eyes: Conjunctivae and EOM are normal. Pupils are equal, round, and reactive to light.  Neck: Neck supple. No thyromegaly present.  Cardiovascular: Normal rate, regular rhythm and normal heart sounds.   Pulmonary/Chest: Effort normal and breath sounds normal. She has no wheezes.  Genitourinary: Vagina normal and uterus normal. There is no rash on the right labia. There is no rash on the left labia. Cervix exhibits no motion tenderness, no discharge and no friability. Right adnexum displays no mass, no tenderness and no fullness. Left adnexum displays no mass, no tenderness and no fullness. No erythema, tenderness or bleeding around the vagina. No foreign body around the vagina. No signs of injury around the vagina. No vaginal discharge found.  Musculoskeletal: She exhibits no edema.  Lymphadenopathy:    She has no cervical adenopathy.  Neurological: She is alert and oriented to person, place, and time.  Skin: Skin is warm and dry.  Psychiatric: She has a normal mood and affect.          Assessment & Plan:  CPE Keep up a regular exercise program and make sure you are eating a healthy diet Try to eat 4 servings of dairy a  day, or if you are lactose intolerant take a calcium with vitamin D daily.  Your vaccines are up to date.  Wil check for GC, chlam Will call with Pap smear results once available.  Recommend screening cholesterol etc. she's never had this done.

## 2013-01-10 ENCOUNTER — Other Ambulatory Visit: Payer: Self-pay | Admitting: Family Medicine

## 2013-07-05 ENCOUNTER — Ambulatory Visit (INDEPENDENT_AMBULATORY_CARE_PROVIDER_SITE_OTHER): Payer: BC Managed Care – PPO | Admitting: Family Medicine

## 2013-07-05 ENCOUNTER — Encounter: Payer: Self-pay | Admitting: Family Medicine

## 2013-07-05 VITALS — BP 113/72 | HR 89 | Wt 124.0 lb

## 2013-07-05 DIAGNOSIS — J029 Acute pharyngitis, unspecified: Secondary | ICD-10-CM

## 2013-07-05 NOTE — Progress Notes (Signed)
CC: Betty Larson is a 23 y.o. female is here for Sore Throat   Subjective: HPI:  Complains of one week of sore throat described as mild/moderate in severity worse with swallowing absent at rest. Interestingly it has been absent there is since she awoke this morning. This is been accompanied by nasal congestion and subjective postnasal drip. Symptoms have been getting better gradually especially with use of over-the-counter decongestants. She's had a cough that is described as mild and nonproductive. Denies fevers, chills, shortness of breath, wheezing, chest pain, fatigue, nor difficulty swallowing   Review Of Systems Outlined In HPI  No past medical history on file.  Past Surgical History  Procedure Laterality Date  . Wisdom tooth extraction     Family History  Problem Relation Age of Onset  . Breast cancer Maternal Grandmother     breast  . Diabetes Maternal Grandfather     History   Social History  . Marital Status: Single    Spouse Name: N/A    Number of Children: N/A  . Years of Education: N/A   Occupational History  . Nanny    Social History Main Topics  . Smoking status: Never Smoker   . Smokeless tobacco: Never Used  . Alcohol Use: No  . Drug Use: No  . Sexual Activity: Yes    Partners: Male   Other Topics Concern  . Not on file   Social History Narrative   No regular exercise. 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     Objective: BP 113/72  Pulse 89  Wt 124 lb (56.246 kg)  General: Alert and Oriented, No Acute Distress HEENT: Pupils equal, round, reactive to light. Conjunctivae clear.  External ears unremarkable, canals clear with intact TMs with appropriate landmarks.  Middle ear appears open without effusion. Pink inferior turbinates.  Moist mucous membranes, pharynx without inflammation nor lesions however mild cobblestoning and postnasal drip.  Neck supple without palpable lymphadenopathy nor  abnormal masses. Uvula is midline Lungs: Clear to auscultation bilaterally, no wheezing/ronchi/rales.  Comfortable work of breathing. Good air movement. Mental Status: No depression, anxiety, nor agitation. Skin: Warm and dry.  Assessment & Plan: Betty Larson was seen today for sore throat.  Diagnoses and associated orders for this visit:  Viral pharyngitis    Reassurance provided that this is almost certainly viral pharyngitis with sore throat and cough due to postnasal drip. I anticipate this will continue to drastically improve over the next one to 2 days. If sinus pressure, fevers, or sore throat return tomorrow or over the weekend call for antibiotic. For now continue decongestant as needed and Mucinex.   Return if symptoms worsen or fail to improve. A

## 2013-11-02 ENCOUNTER — Encounter: Payer: Self-pay | Admitting: Family Medicine

## 2013-11-02 ENCOUNTER — Other Ambulatory Visit (HOSPITAL_COMMUNITY)
Admission: RE | Admit: 2013-11-02 | Discharge: 2013-11-02 | Disposition: A | Discharge status: Home / Self Care | Payer: BC Managed Care – PPO | Source: Ambulatory Visit | Attending: Family Medicine | Admitting: Family Medicine

## 2013-11-02 ENCOUNTER — Ambulatory Visit (INDEPENDENT_AMBULATORY_CARE_PROVIDER_SITE_OTHER): Payer: BC Managed Care – PPO | Admitting: Family Medicine

## 2013-11-02 VITALS — BP 122/68 | HR 79 | Ht 67.0 in | Wt 121.0 lb

## 2013-11-02 DIAGNOSIS — Z01419 Encounter for gynecological examination (general) (routine) without abnormal findings: Secondary | ICD-10-CM | POA: Insufficient documentation

## 2013-11-02 DIAGNOSIS — Z113 Encounter for screening for infections with a predominantly sexual mode of transmission: Secondary | ICD-10-CM | POA: Insufficient documentation

## 2013-11-02 DIAGNOSIS — Z124 Encounter for screening for malignant neoplasm of cervix: Secondary | ICD-10-CM

## 2013-11-02 DIAGNOSIS — Z13220 Encounter for screening for lipoid disorders: Secondary | ICD-10-CM

## 2013-11-02 MED ORDER — DROSPIRENONE-ETHINYL ESTRADIOL 3-0.02 MG PO TABS: 1.0000 | ORAL_TABLET | Freq: Every day | ORAL | Status: DC

## 2013-11-02 NOTE — Progress Notes (Signed)
   Subjective:    Patient ID: Betty Larson, female    DOB: 1990-10-15, 23 y.o.   MRN: [id number]  HPI Here for Pap only. No specific problems or concerns. Pelvic pain or vaginal discharge. She has no other concerns today. She would like a refill on her birth control. She's very happy with her regimen and says she takes it consistently.   Review of Systems     Objective:   Physical Exam  Constitutional: She is oriented to person, place, and time. She appears well-developed and well-nourished.  HENT:  Head: Normocephalic and atraumatic.  Genitourinary: Vagina normal and uterus normal. No labial fusion. There is no rash, tenderness, lesion or injury on the right labia. There is no rash, tenderness, lesion or injury on the left labia. Uterus is not deviated, not enlarged, not fixed and not tender. Cervix exhibits friability. Cervix exhibits no motion tenderness and no discharge. Right adnexum displays no mass, no tenderness and no fullness. Left adnexum displays no mass, no tenderness and no fullness.  Neurological: She is alert and oriented to person, place, and time.  Skin: Skin is warm and dry.  Psychiatric: She has a normal mood and affect. Her behavior is normal.          Assessment & Plan:  Pap smear performed today. We'll test for gonorrhea and Chlamydia. We'll call the results. Refill sent for birth control.

## 2013-11-07 ENCOUNTER — Other Ambulatory Visit: Payer: Self-pay | Admitting: Family Medicine

## 2013-11-07 LAB — CYTOLOGY - PAP

## 2013-11-07 MED ORDER — FLUCONAZOLE 150 MG PO TABS: 150.0000 mg | ORAL_TABLET | Freq: Every day | ORAL | Status: DC

## 2013-11-16 ENCOUNTER — Encounter: Payer: Self-pay | Admitting: Emergency Medicine

## 2013-11-16 ENCOUNTER — Emergency Department
Admission: EM | Admit: 2013-11-16 | Discharge: 2013-11-16 | Disposition: A | Discharge status: Home / Self Care | Payer: BC Managed Care – PPO | Source: Home / Self Care | Attending: Family Medicine | Admitting: Family Medicine

## 2013-11-16 DIAGNOSIS — H04309 Unspecified dacryocystitis of unspecified lacrimal passage: Secondary | ICD-10-CM

## 2013-11-16 DIAGNOSIS — H04302 Unspecified dacryocystitis of left lacrimal passage: Secondary | ICD-10-CM

## 2013-11-16 MED ORDER — TOBRAMYCIN 0.3 % OP SOLN: 2.0000 [drp] | OPHTHALMIC | Status: DC

## 2013-11-16 MED ORDER — AMOXICILLIN-POT CLAVULANATE 875-125 MG PO TABS: 1.0000 | ORAL_TABLET | Freq: Two times a day (BID) | ORAL | Status: DC

## 2013-11-16 NOTE — ED Provider Notes (Signed)
CSN: [id number]     Arrival date & time 11/16/13  1024 History   First MD Initiated Contact with Patient 11/16/13 1125     Chief Complaint  Patient presents with  . Stye      HPI Comments: Patient complains of onset of a "stye" in her left eye yesterday.  She noticed swelling at the medial aspect of her left lower lid  Patient is a 23 y.o. female presenting with eye problem. The history is provided by the patient.  Eye Problem Location:  L eye Quality: redness and swelling. Severity:  Mild Onset quality:  Sudden Duration:  1 day Timing:  Constant Progression:  Worsening Chronicity:  New Relieved by:  Nothing Worsened by:  Nothing tried Ineffective treatments: warm compresses. Associated symptoms: redness and swelling   Associated symptoms: no blurred vision, no crusting, no decreased vision, no double vision, no photophobia and no tearing   Risk factors: no recent URI     History reviewed. No pertinent past medical history. Past Surgical History  Procedure Laterality Date  . Wisdom tooth extraction     Family History  Problem Relation Age of Onset  . Breast cancer Maternal Grandmother     breast  . Diabetes Maternal Grandfather    History  Substance Use Topics  . Smoking status: Never Smoker   . Smokeless tobacco: Never Used  . Alcohol Use: No   OB History   Grav Para Term Preterm Abortions TAB SAB Ect Mult Living                 Review of Systems  Eyes: Positive for redness. Negative for blurred vision, double vision and photophobia.  All other systems reviewed and are negative.   Allergies  Review of patient's allergies indicates no known allergies.  Home Medications   Prior to Admission medications   Medication Sig Start Date End Date Taking? Authorizing Provider  amoxicillin-clavulanate (AUGMENTIN) 875-125 MG per tablet Take 1 tablet by mouth 2 (two) times daily. Take with food (Rx void after 11/24/13) 11/16/13   Lattie HawStephen A Beese, MD  drospirenone-ethinyl  estradiol (LORYNA) 3-0.02 MG tablet Take 1 tablet by mouth daily. 11/02/13   Agapito Gamesatherine D Metheney, MD  fluconazole (DIFLUCAN) 150 MG tablet Take 1 tablet (150 mg total) by mouth daily. 11/07/13   Agapito Gamesatherine D Metheney, MD  tobramycin (TOBREX) 0.3 % ophthalmic solution Place 2 drops into the left eye every 4 (four) hours. 11/16/13   Lattie HawStephen A Beese, MD   BP 121/74  Pulse 78  Temp(Src) 98.6 F (37 C) (Oral)  Resp 16  Ht 5\' 7"  (1.702 m)  Wt 120 lb (54.432 kg)  BMI 18.79 kg/m2  SpO2 97%  LMP 10/21/2013 Physical Exam  Nursing note and vitals reviewed. Constitutional: She appears well-developed and well-nourished.  HENT:  Head: Normocephalic.  Nose: Nose normal.  Mouth/Throat: Oropharynx is clear and moist.  Eyes: Conjunctivae and EOM are normal. Pupils are equal, round, and reactive to light. Right eye exhibits no discharge. Left eye exhibits no discharge, no exudate and no hordeolum.    Left eye reveals swelling, erythema, and tenderness over the nasolacrimal duct  Neck: Neck supple.  Lymphadenopathy:    She has no cervical adenopathy.    ED Course  Procedures           MDM   1. Dacrocystitis, left    Begin Tobrex susp.   Add Augmentin if not improved 24 to 36 hours. Continue warm compresses several times daily.  May take Ibuprofen 200mg , 3 or 4 tabs every 8 hours with food.  Followup with ophthalmologist if not improved 3 days.    Lattie Haw, MD 11/20/13 2001

## 2013-11-16 NOTE — Discharge Instructions (Signed)
Continue warm compresses several times daily.  May take Ibuprofen 200mg , 3 or 4 tabs every 8 hours with food.    Dacryocystitis  Dacryocystitis is an infection of the tear sac (lacrimal sac). The lacrimal sac lies between the inner corner of the eyelids and the nose. The glands of the eyelids produce tears. This is to keep the surface of the eye wet and protect it. These tears drain from the surface of the eyes through a duct in each lid (lacrimal ducts), then through the lacrimal sac into the nose. The tears are then swallowed. If the lacrimal sacs become blocked, bacteria begin to buildup. The lacrimal sacs can become infected. Dacryocystitis may be sudden (acute) or long-lasting (chronic). This problem is most common in infants because the tear ducts are not fully developed and clog easily. In that case, infants may have episodes of tearing and infection. However, in most cases, the problem gets better as the infant grows. CAUSES  The cause is often unknown. Known causes can include:  Malformation of the lacrimal sac.  Injury to the eye.  Eye infection.  Injury or inflammation of the nasal passages. SYMPTOMS   Usually only one eye is involved.  Excessive tearing and watering from the involved eye.  Tenderness, redness, and swelling of the lower lid near the nose.  A sore, red, inflamed bump on the inner corner of the lower lid. DIAGNOSIS  A diagnosis is made after an eye exam to see how much blockage is present and if the surface of the eye is also infected. A culture of the fluid from the lacrimal sac may be examined to find if a specific infection is present. TREATMENT  Treatment depends on:   The person's age.  Whether or not the infection is chronic or acute.  The amount of blockage that is present. Additional treatment Sometimes massaging the area (starting from the inside of the eye and gently massaging down toward the nose) will improve the condition, combined with  antibiotic eyedrops or ointments. If massaging the area does not work, it may be necessary to probe the ducts and open up the drainage system. While this is easily done in the office in adults, probing usually has to be done under general anesthesia in infants.  If the blockage cannot be cleared by probing, surgery may be needed under general anesthesia to create a direct opening for tears to flow between the lacrimal sac and the inside of the nose (dacryocystorhinostomy, DCR). HOME CARE INSTRUCTIONS   Use any antibiotic eyedrops, ointment, or pills as directed by the caregiver. Finish all medicines even if the symptoms start to get better.  Massage the lacrimal sac as directed by the caregiver. SEEK IMMEDIATE MEDICAL CARE IF:   There is increased pain, swelling, redness, or drainage from the eye.  Muscle aches, chills, or a general sick feeling develop.  A fever or persistent symptoms develop for more than 2-3 days.  The fever and symptoms suddenly get worse. MAKE SURE YOU:   Understand these instructions.  Will watch your condition.  Will get help right away if you are not doing well or get worse. Document Released: 04/09/2000 Document Revised: 08/27/2013 Document Reviewed: 09/13/2011 [facility] Patient Information 2015 [employer] [place]. This information is not intended to replace advice given to you by your health care provider. Make sure you discuss any questions you have with your health care provider.

## 2013-11-16 NOTE — ED Notes (Signed)
Patient reports yesterday onset of stye in the left eye; lower lid; inner canthus. Has used warm compresses and is taking ibuprofen.

## 2014-01-15 ENCOUNTER — Other Ambulatory Visit: Payer: Self-pay | Admitting: Family Medicine

## 2014-02-11 ENCOUNTER — Other Ambulatory Visit: Payer: Self-pay | Admitting: Family Medicine

## 2014-03-11 ENCOUNTER — Other Ambulatory Visit: Payer: Self-pay | Admitting: Family Medicine

## 2014-07-22 ENCOUNTER — Ambulatory Visit (INDEPENDENT_AMBULATORY_CARE_PROVIDER_SITE_OTHER): Payer: BLUE CROSS/BLUE SHIELD | Admitting: Physician Assistant

## 2014-07-22 ENCOUNTER — Encounter: Payer: Self-pay | Admitting: Physician Assistant

## 2014-07-22 VITALS — BP 135/84 | HR 89 | Temp 98.0°F | Resp 20 | Ht 66.0 in | Wt 122.0 lb

## 2014-07-22 DIAGNOSIS — R21 Rash and other nonspecific skin eruption: Secondary | ICD-10-CM | POA: Diagnosis not present

## 2014-07-22 MED ORDER — KETOCONAZOLE 2 % EX CREA: 1.0000 "application " | TOPICAL_CREAM | Freq: Two times a day (BID) | CUTANEOUS | Status: DC

## 2014-07-22 NOTE — Progress Notes (Signed)
   Subjective:    Patient ID: Johny ShockHannah McMurtry, female    DOB: [birth year], 24 y.o.   MRN: [id number]  HPI  Patient is a [age] year old female who presents to the clinic with a linear slightly raised hypopigmented lesion of her right cheek. She has noticed the lesion for approximately one month. She does not like is getting worse. Occasionally it will itch but not on a regular basis. She's not tried anything to make better. She missed a having pets. She denies any recent travel.  Review of Systems  All other systems reviewed and are negative.      Objective:   Physical Exam  Constitutional: She is oriented to person, place, and time. She appears well-developed and well-nourished.  HENT:  Head:    Neurological: She is alert and oriented to person, place, and time.  Psychiatric: She has a normal mood and affect. Her behavior is normal.          Assessment & Plan:  RASh- unclear etiology. Appears most like ringworm but no scales to culture and not itching. Will treat with nizoral bid. i will make dermatology referral.

## 2014-09-24 ENCOUNTER — Other Ambulatory Visit: Payer: Self-pay | Admitting: Family Medicine

## 2014-10-07 ENCOUNTER — Encounter: Payer: Self-pay | Admitting: Family Medicine

## 2014-10-07 ENCOUNTER — Ambulatory Visit (INDEPENDENT_AMBULATORY_CARE_PROVIDER_SITE_OTHER): Payer: BLUE CROSS/BLUE SHIELD | Admitting: Family Medicine

## 2014-10-07 VITALS — BP 111/74 | HR 66 | Ht 66.0 in | Wt 122.0 lb

## 2014-10-07 DIAGNOSIS — N760 Acute vaginitis: Secondary | ICD-10-CM

## 2014-10-07 DIAGNOSIS — R300 Dysuria: Secondary | ICD-10-CM | POA: Diagnosis not present

## 2014-10-07 DIAGNOSIS — Z114 Encounter for screening for human immunodeficiency virus [HIV]: Secondary | ICD-10-CM | POA: Diagnosis not present

## 2014-10-07 DIAGNOSIS — Z23 Encounter for immunization: Secondary | ICD-10-CM | POA: Diagnosis not present

## 2014-10-07 DIAGNOSIS — R3915 Urgency of urination: Secondary | ICD-10-CM

## 2014-10-07 LAB — POCT URINALYSIS DIPSTICK
Bilirubin, UA: NEGATIVE
Glucose, UA: NEGATIVE
KETONES UA: NEGATIVE
Leukocytes, UA: NEGATIVE
Nitrite, UA: NEGATIVE
PH UA: 6.5
Protein, UA: NEGATIVE
RBC UA: NEGATIVE
Spec Grav, UA: 1.02
Urobilinogen, UA: 0.2

## 2014-10-07 LAB — WET PREP FOR TRICH, YEAST, CLUE
CLUE CELLS WET PREP: NONE SEEN
Trich, Wet Prep: NONE SEEN
Yeast Wet Prep HPF POC: NONE SEEN

## 2014-10-07 NOTE — Progress Notes (Signed)
   Subjective:    Patient ID: Betty Larson, female    DOB: [birth year], 24 y.o.   MRN: [id number]  HPI  She has had urinary urgency and pressure after urinates x 3 days. No hematuria. No back pain. No fever.  No vaginal d/c.  Though due for her pap bc OCPs ran out. No abdominal pain.  + sexually active.    Review of Systems     Objective:   Physical Exam  Constitutional: She is oriented to person, place, and time. She appears well-developed and well-nourished.  HENT:  Head: Normocephalic and atraumatic.  Eyes: Conjunctivae and EOM are normal.  Cardiovascular: Normal rate.   Pulmonary/Chest: Effort normal.  Abdominal: Hernia confirmed negative in the right inguinal area and confirmed negative in the left inguinal area.  Genitourinary: Vagina normal and uterus normal. There is no rash, tenderness, lesion or injury on the right labia. There is no rash, tenderness, lesion or injury on the left labia. Cervix exhibits no motion tenderness, no discharge and no friability. Right adnexum displays no mass, no tenderness and no fullness. Left adnexum displays no mass, no tenderness and no fullness. No erythema, tenderness or bleeding in the vagina. No foreign body around the vagina. No signs of injury around the vagina. No vaginal discharge found.  Lymphadenopathy:       Right: No inguinal adenopathy present.       Left: No inguinal adenopathy present.  Neurological: She is alert and oriented to person, place, and time.  Skin: Skin is dry. No pallor.  Psychiatric: She has a normal mood and affect. Her behavior is normal.          Assessment & Plan:  Dysuria - urinalysis is negative. We'll send for culture. Also perform gonorrhea Chlamydia and wet prep testing. We'll call results once available.   Cervical cancer screening - She's little bit too early for her Pap smear so we can do it later this fall. Birth control has been refilled.

## 2014-10-08 LAB — GC/CHLAMYDIA PROBE AMP
CT Probe RNA: NEGATIVE
GC PROBE AMP APTIMA: NEGATIVE

## 2014-10-09 LAB — URINE CULTURE
Colony Count: NO GROWTH
Organism ID, Bacteria: NO GROWTH

## 2015-01-23 ENCOUNTER — Encounter: Payer: Self-pay | Admitting: Osteopathic Medicine

## 2015-01-23 ENCOUNTER — Ambulatory Visit (INDEPENDENT_AMBULATORY_CARE_PROVIDER_SITE_OTHER): Payer: BLUE CROSS/BLUE SHIELD | Admitting: Osteopathic Medicine

## 2015-01-23 VITALS — BP 125/81 | HR 75 | Temp 96.8°F | Ht 66.0 in | Wt 124.0 lb

## 2015-01-23 DIAGNOSIS — R3915 Urgency of urination: Secondary | ICD-10-CM

## 2015-01-23 DIAGNOSIS — N898 Other specified noninflammatory disorders of vagina: Secondary | ICD-10-CM | POA: Diagnosis not present

## 2015-01-23 LAB — POCT URINALYSIS DIPSTICK
BILIRUBIN UA: NEGATIVE
Glucose, UA: NEGATIVE
Ketones, UA: NEGATIVE
Leukocytes, UA: NEGATIVE
Nitrite, UA: NEGATIVE
PH UA: 6
Protein, UA: NEGATIVE
RBC UA: NEGATIVE
SPEC GRAV UA: 1.025
UROBILINOGEN UA: 0.2

## 2015-01-23 LAB — WET PREP FOR TRICH, YEAST, CLUE
CLUE CELLS WET PREP: NONE SEEN
Trich, Wet Prep: NONE SEEN
YEAST WET PREP: NONE SEEN

## 2015-01-23 NOTE — Progress Notes (Signed)
Chief Complaint: Possible UTI  History of Present Illness: Betty Larson is a 24 y.o. female who presents to [facility] [place]  today with concerns for acute urinary tract infection  Onset: 3 DAYS Quality: Burning/Urgency Exacerbating factors:   Frequency: yes  Hematuria: no  Odor: no  Fever/chills: no  Incontinence: no  Flank Pain: no Previous UTI: seen earlier this year for similar symptoms, UCx neg Recurrent UTI (3 times/more annually): no Abx in past 3 months: no  Recently at [facility] over the weekend, walking around with wet clothing. No new sexual partner, no traumatic intercourse, no change in lubricant or detergent. Feels better today but was concerned because home urine test was (+) for leukocytes  Complicated (any of the following): No ?Diabetes ?Pregnancy ?Symptoms for seven or more days before seeking care ?Hospital acquired infection ?Renal failure ?Urinary tract obstruction ?Presence of an indwelling urethral catheter, stent, nephrostomy tube or urinary diversion ?Functional or anatomic abnormality of the urinary tract ?Renal transplantation ?Immunosuppression   Past medical, social and family history reviewed: No past medical history on file. Past Surgical History  Procedure Laterality Date  . Wisdom tooth extraction     Social History  Substance Use Topics  . Smoking status: Never Smoker   . Smokeless tobacco: Never Used  . Alcohol Use: No   The patient has a family history of  Current Outpatient Prescriptions  Medication Sig Dispense Refill  . LORYNA 3-0.02 MG tablet TAKE 1 TABLET BY MOUTH DAILY. 28 tablet 7   No current facility-administered medications for this visit.   No Known Allergies   Review of Systems: CONSTITUTIONAL: Negative fever/chills CARDIAC: No chest pain/pressure/palpitations, no orthopnea RESPIRATORY: No cough/shortness of breath/wheeze GASTROINTESTINAL: No nausea/vomiting/abdominal  pain/blood in stool/diarrhea/constipation MUSCULOSKELETAL: No myalgia/arthralgia/back pain GENITOURINARY: Negative incontinence, Negative abnormal genital bleeding/discharge. (+)Dysuria/UTI symptoms as per HPI   Exam:  Filed Vitals:   01/23/15 0912  Height:  (1.676 m)  Weight: 124 lb (56.246 kg)   Constitutional: VSS, see above. General Appearance: alert, well-developed, well-nourished, NAD Respiratory: Normal respiratory effort. Breath sounds normal, no wheeze/rhonchi/rales Cardiovascular: S1/S2 normal, no murmur/rub/gallop auscultated. RRR Gastrointestinal: Nontender, no masses. No hepatomegaly, no splenomegaly. No hernia appreciated. Rectal exam deferred.  Musculoskeletal: Gait normal. No clubbing/cyanosis of digits. Lloyd sign Negative bilateral     ASSESSMENT/PLAN:  Urinary urgency - Plan: POCT urinalysis dipstick, Urine Culture  Vaginal irritation - Plan: WET PREP FOR TRICH, YEAST, CLUE, GC/chlamydia probe amp, urine, Pregnancy, urine  Doesn't need Pap, had 2 normals 1 year apart.   Infection unlikely based on current tests and symptoms, will send for culture and test wet mount for yeast/BV treat based on these results. Pt to RTC if no improvement or if any concerns.

## 2015-01-24 LAB — GC/CHLAMYDIA PROBE AMP, URINE
Chlamydia, Swab/Urine, PCR: NEGATIVE
GC Probe Amp, Urine: NEGATIVE

## 2015-01-24 LAB — PREGNANCY, URINE: PREG TEST UR: NEGATIVE

## 2015-01-25 LAB — URINE CULTURE

## 2015-05-26 ENCOUNTER — Other Ambulatory Visit: Payer: Self-pay | Admitting: Family Medicine

## 2015-09-01 ENCOUNTER — Other Ambulatory Visit: Payer: Self-pay | Admitting: *Deleted

## 2015-09-01 MED ORDER — DROSPIRENONE-ETHINYL ESTRADIOL 3-0.02 MG PO TABS: 1.0000 | ORAL_TABLET | Freq: Every day | ORAL | Status: DC

## 2015-10-01 ENCOUNTER — Telehealth: Payer: Self-pay | Admitting: Family Medicine

## 2015-10-01 NOTE — Telephone Encounter (Signed)
I called patient and and left a message for her to schedule appt for CPE and Birth Control

## 2015-10-09 ENCOUNTER — Ambulatory Visit (INDEPENDENT_AMBULATORY_CARE_PROVIDER_SITE_OTHER): Payer: PRIVATE HEALTH INSURANCE | Admitting: Family Medicine

## 2015-10-09 ENCOUNTER — Encounter: Payer: Self-pay | Admitting: Family Medicine

## 2015-10-09 VITALS — BP 110/61 | HR 85 | Wt 125.0 lb

## 2015-10-09 DIAGNOSIS — Z309 Encounter for contraceptive management, unspecified: Secondary | ICD-10-CM

## 2015-10-09 DIAGNOSIS — Z3009 Encounter for other general counseling and advice on contraception: Secondary | ICD-10-CM

## 2015-10-09 NOTE — Progress Notes (Signed)
Subjective:    CC: OCP  HPI:  Patient is here today to follow-up on her birth control. She is very happy with her current regimen but her insurance would not cover the medication.  Her last Pap smear was July 10 of 2015 and she is due to repeat her Pap smear next year in 2018. She is not currently interested in getting pregnant and would like to continue her birth control. She spoke with her cousin who has the On implant and would like to discuss that further today.  Past medical history, Surgical history, Family history not pertinant except as noted below, Social history, Allergies, and medications have been entered into the medical record, reviewed, and corrections made.   Review of Systems: No fevers, chills, night sweats, weight loss, chest pain, or shortness of breath.   Objective:    General: Well Developed, well nourished, and in no acute distress.  Neuro: Alert and oriented x3, extra-ocular muscles intact, sensation grossly intact.  HEENT: Normocephalic, atraumatic  Skin: Warm and dry, no rashes. Cardiac: Regular rate and rhythm, no murmurs rubs or gallops, no lower extremity edema.  Respiratory: Clear to auscultation bilaterally. Not using accessory muscles, speaking in full sentences.   Impression and Recommendations:    Contraceptive counseling-refills sent to pharmacy. Blood pressure looks fantastic today. Follow-up in one year for physical with Pap smear.  She is actually most interested in the Nexplanon.  Discussed the medication and gave her an additional handout with some information to help answer questions. Encouraged her to schedule with Dr. Sunnie NielsenNatalie Alexander for placement. We also discussed IUD as an option as well.  Time spent 15 minutes, greater than 50% time spent counseling about birth control options.

## 2015-11-07 ENCOUNTER — Encounter: Payer: Self-pay | Admitting: Emergency Medicine

## 2015-11-07 ENCOUNTER — Emergency Department
Admission: EM | Admit: 2015-11-07 | Discharge: 2015-11-07 | Disposition: A | Discharge status: Home / Self Care | Payer: PRIVATE HEALTH INSURANCE | Source: Home / Self Care | Attending: Family Medicine | Admitting: Family Medicine

## 2015-11-07 DIAGNOSIS — L02415 Cutaneous abscess of right lower limb: Secondary | ICD-10-CM | POA: Diagnosis not present

## 2015-11-07 MED ORDER — DOXYCYCLINE HYCLATE 100 MG PO CAPS: 100.0000 mg | ORAL_CAPSULE | Freq: Two times a day (BID) | ORAL | Status: DC

## 2015-11-07 NOTE — Discharge Instructions (Signed)
Incision and Drainage °Incision and drainage is a procedure in which a sac-like structure (cystic structure) is opened and drained. The area to be drained usually contains material such as pus, fluid, or blood.  °LET YOUR CAREGIVER KNOW ABOUT:  °· Allergies to medicine. °· Medicines taken, including vitamins, herbs, eyedrops, over-the-counter medicines, and creams. °· Use of steroids (by mouth or creams). °· Previous problems with anesthetics or numbing medicines. °· History of bleeding problems or blood clots. °· Previous surgery. °· Other health problems, including diabetes and kidney problems. °· Possibility of pregnancy, if this applies. °RISKS AND COMPLICATIONS °· Pain. °· Bleeding. °· Scarring. °· Infection. °BEFORE THE PROCEDURE  °You may need to have an ultrasound or other imaging tests to see how large or deep your cystic structure is. Blood tests may also be used to determine if you have an infection or how severe the infection is. You may need to have a tetanus shot. °PROCEDURE  °The affected area is cleaned with a cleaning fluid. The cyst area will then be numbed with a medicine (local anesthetic). A small incision will be made in the cystic structure. A syringe or catheter may be used to drain the contents of the cystic structure, or the contents may be squeezed out. The area will then be flushed with a cleansing solution. After cleansing the area, it is often gently packed with a gauze or another wound dressing. Once it is packed, it will be covered with gauze and tape or some other type of wound dressing.  °AFTER THE PROCEDURE  °· Often, you will be allowed to go home right after the procedure. °· You may be given antibiotic medicine to prevent or heal an infection. °· If the area was packed with gauze or some other wound dressing, you will likely need to come back in 1 to 2 days to get it removed. °· The area should heal in about 14 days. °  °This information is not intended to replace advice given  to you by your health care provider. Make sure you discuss any questions you have with your health care provider. °  °Document Released: 10/06/2000 Document Revised: 10/12/2011 Document Reviewed: 06/07/2011 °Elsevier Interactive Patient Education ©2016 [employer] ° °Abscess °An abscess (boil or furuncle) is an infected area on or under the skin. This area is filled with yellowish-white fluid (pus) and other material (debris). °HOME CARE  °· Only take medicines as told by your doctor. °· If you were given antibiotic medicine, take it as directed. Finish the medicine even if you start to feel better. °· If gauze is used, follow your doctor's directions for changing the gauze. °· To avoid spreading the infection: °¨ Keep your abscess covered with a bandage. °¨ Wash your hands well. °¨ Do not share personal care items, towels, or whirlpools with others. °¨ Avoid skin contact with others. °· Keep your skin and clothes clean around the abscess. °· Keep all doctor visits as told. °GET HELP RIGHT AWAY IF:  °· You have more pain, puffiness (swelling), or redness in the wound site. °· You have more fluid or blood coming from the wound site. °· You have muscle aches, chills, or you feel sick. °· You have a fever. °MAKE SURE YOU:  °· Understand these instructions. °· Will watch your condition. °· Will get help right away if you are not doing well or get worse. °  °This information is not intended to replace advice given to you by your health care provider.   Make sure you discuss any questions you have with your health care provider. °  °Document Released: 09/29/2007 Document Revised: 10/12/2011 Document Reviewed: 06/26/2011 °Elsevier Interactive Patient Education ©2016 Elsevier Inc. ° °

## 2015-11-07 NOTE — ED Notes (Signed)
Abscess upper rt thigh x 4 days

## 2015-11-07 NOTE — ED Provider Notes (Signed)
CSN: [id number]     Arrival date & time 11/07/15  1424 History   First MD Initiated Contact with Patient 11/07/15 1438     Chief Complaint  Patient presents with  . Abscess   (Consider location/radiation/quality/duration/timing/severity/associated sxs/prior Treatment) HPI Betty Larson is a 25 y.o. female presenting to UC with c/o redness, pain and swelling of skin near her Right groin.  She notes redness has gradually improved since onset 4 days ago with use of epson salt soaks but she is leaving for beach tomorrow and wanted to make sure it did not get worse. Pain is burning, 6/10, worse with palpation. Ibuprofen does provide moderate relief.  Hx of similar abscess in under arm in the past but that did not need to be drained.  No drainage. No fever, chills, nausea or vomiting.    History reviewed. No pertinent past medical history. Past Surgical History  Procedure Laterality Date  . Wisdom tooth extraction     Family History  Problem Relation Age of Onset  . Breast cancer Maternal Grandmother     breast  . Diabetes Maternal Grandfather    Social History  Substance Use Topics  . Smoking status: Never Smoker   . Smokeless tobacco: Never Used  . Alcohol Use: No   OB History    No data available     Review of Systems  Constitutional: Negative for fever and chills.  Gastrointestinal: Negative for nausea and vomiting.  Musculoskeletal: Negative for myalgias and arthralgias.  Skin: Positive for color change. Negative for wound.    Allergies  Review of patient's allergies indicates no known allergies.  Home Medications   Prior to Admission medications   Medication Sig Start Date End Date Taking? Authorizing Provider  doxycycline (VIBRAMYCIN) 100 MG capsule Take 1 capsule (100 mg total) by mouth 2 (two) times daily. One po bid x 7 days 11/07/15   Junius Finner, PA-C   Meds Ordered and Administered this Visit  Medications - No data to display  BP 116/78 mmHg  Pulse 75   Temp(Src) 98.2 F (36.8 C) (Oral)  Ht  (1.575 m)  Wt 121 lb (54.885 kg)  BMI 22.13 kg/m2  SpO2 98%  LMP 10/15/2015 No data found.   Physical Exam  Constitutional: She is oriented to person, place, and time. She appears well-developed and well-nourished.  HENT:  Head: Normocephalic and atraumatic.  Eyes: EOM are normal.  Neck: Normal range of motion.  Cardiovascular: Normal rate.   Pulmonary/Chest: Effort normal.  Musculoskeletal: Normal range of motion.  Neurological: She is alert and oriented to person, place, and time.  Skin: Skin is warm and dry. Rash noted. There is erythema.  Right upper thigh/groin: 2cm area of erythema, tenderness and centralized pustule. No active bleeding or discharge. Minimal fluctuance.  Psychiatric: She has a normal mood and affect. Her behavior is normal.  Nursing note and vitals reviewed.   ED Course  .Marland KitchenIncision and Drainage Date/Time: 11/07/2015 3:19 PM Performed by: Junius Finner Authorized by: Donna Christen A Consent: Verbal consent obtained. Risks and benefits: risks, benefits and alternatives were discussed Consent given by: patient Patient understanding: patient states understanding of the procedure being performed Patient consent: the patient's understanding of the procedure matches consent given Required items: required blood products, implants, devices, and special equipment available Patient identity confirmed: verbally with patient Time out: Immediately prior to procedure a "time out" was called to verify the correct patient, procedure, equipment, support staff and site/side marked as required. Type: abscess  Body area: lower extremity Location details: right leg Anesthesia: local infiltration Local anesthetic: lidocaine 1% without epinephrine Anesthetic total: 1 ml Patient sedated: no Scalpel size: 11 Incision type: single straight Incision depth: subcutaneous Complexity: simple Drainage: purulent and  bloody Drainage  amount: scant Wound treatment: wound left open Patient tolerance: Patient tolerated the procedure well with no immediate complications   (including critical care time)  Labs Review Labs Reviewed - No data to display  Imaging Review No results found.    MDM   1. Abscess of right thigh    Abscess of Right upper thigh. I&D successfully performed.   Rx: Doxycycline (pt denies change of pregnancy or attempting to become pregnant) Encouraged to continue warm compresses.  F/u with PCP in 4-5 days if not improving, sooner if worsening. Patient verbalized understanding and agreement with treatment plan.     Junius Finnerrin O'Malley, PA-C 11/07/15 [phone]

## 2015-11-14 ENCOUNTER — Other Ambulatory Visit: Payer: Self-pay | Admitting: Family Medicine

## 2015-12-13 ENCOUNTER — Other Ambulatory Visit: Payer: Self-pay | Admitting: Family Medicine

## 2016-09-24 ENCOUNTER — Telehealth: Payer: Self-pay

## 2016-09-24 NOTE — Telephone Encounter (Signed)
I agree. It is the nicotine component that increases the risk so if she is using E cigarettes actually contain nicotine and are not just taper and she is at increased risk of blood clots while taking birth control. Definitely encourage her to quit smoking

## 2016-09-24 NOTE — Telephone Encounter (Signed)
Betty Larson called and asked if there is any concern with taking birth control pills and using E-cigarettes. I stated that I'm not aware of problems. Also stated that there has not been a lot of research on E-cigarettes. I believe she is concerned about increased risk of blood clots. I advised that the risk is for smoking cigarettes and not for E-cigarettes. But after some research I found this article. Please advise.    More evidence that e-cigarettes are as bad as cigarettes for blood vessels, this time on skin July 05, 2015 Betty SalivaStanton A. Glantz, PhD  The evidence that e-cigarettes are just as bad as conventional cigarettes for effects on blood and blood vessels keeps piling up. Betty Lindaann SloughSabrina Larson and colleagues at the [employer] [place] just published Betty Larson"Electronic Cigarettes Are as Toxic to Skin Flap Survival as Tobacco Cigarettes" in the [employer]als [employer] Survey.   An important factor in wound healing is adequate circulation in small blood vessels. Inadequate circulation leads to slower healing and even tissue death (necrosis). Betty Larson and colleagues did an experiment in which they exposed rats to high levels of secondhand cigarette smoke and aerosol from two different Blu e-cigarettes, one that exposed the rats to the same level of airborne nicotine as the tobacco cigarettes and one that exposed them to twice the nicotine. The rats were exposed for 4 weeks, then surgery done, and the wound measured after another week of exposure.  Bottom line: The effects on the cigarette smoke and e-cigarette aerosol were the same, independent of the nicotine level.

## 2016-09-27 NOTE — Telephone Encounter (Signed)
Left a message advising of recommendations.  

## 2016-10-14 ENCOUNTER — Other Ambulatory Visit: Payer: Self-pay | Admitting: Family Medicine

## 2016-10-14 NOTE — Telephone Encounter (Signed)
Spoke w/pt and advised her that she is due for CPE w/pap. She informed me that she does not have insurance at this time and asked about the cost of this. I told her that I was unsure and told her that we could try to work with her until she was able to get her insurance back. I did transfer her to [facility] to speak with her about what the possible cost for the OV could be and told her that I would refill her OPC and to just keep us informed about what's going on.Laureen Ochs.Lamona Eimer, Viann Shoveonya Lynetta

## 2016-12-10 ENCOUNTER — Other Ambulatory Visit: Payer: Self-pay

## 2016-12-10 MED ORDER — DROSPIRENONE-ETHINYL ESTRADIOL 3-0.02 MG PO TABS: 1.0000 | ORAL_TABLET | Freq: Every day | ORAL | 4 refills | Status: DC

## 2017-01-17 ENCOUNTER — Other Ambulatory Visit (HOSPITAL_COMMUNITY)
Admission: RE | Admit: 2017-01-17 | Discharge: 2017-01-17 | Disposition: A | Discharge status: Home / Self Care | Payer: Self-pay | Source: Ambulatory Visit | Attending: Family Medicine | Admitting: Family Medicine

## 2017-01-17 ENCOUNTER — Encounter: Payer: Self-pay | Admitting: Family Medicine

## 2017-01-17 ENCOUNTER — Ambulatory Visit (INDEPENDENT_AMBULATORY_CARE_PROVIDER_SITE_OTHER): Payer: PRIVATE HEALTH INSURANCE | Admitting: Family Medicine

## 2017-01-17 VITALS — BP 113/66 | HR 80 | Wt 116.0 lb

## 2017-01-17 DIAGNOSIS — R5383 Other fatigue: Secondary | ICD-10-CM

## 2017-01-17 DIAGNOSIS — Z124 Encounter for screening for malignant neoplasm of cervix: Secondary | ICD-10-CM

## 2017-01-17 DIAGNOSIS — Z0000 Encounter for general adult medical examination without abnormal findings: Secondary | ICD-10-CM | POA: Insufficient documentation

## 2017-01-17 DIAGNOSIS — N63 Unspecified lump in unspecified breast: Secondary | ICD-10-CM

## 2017-01-17 LAB — LIPID PANEL W/REFLEX DIRECT LDL
CHOL/HDL RATIO: 2.5 (calc) (ref <5.0)
CHOLESTEROL: 183 mg/dL (ref <200)
HDL: 72 mg/dL (ref >50)
LDL CHOLESTEROL (CALC): 85 mg/dL
Non-HDL Cholesterol (Calc): 111 mg/dL (calc) (ref <130)
TRIGLYCERIDES: 159 mg/dL — AB (ref <150)

## 2017-01-17 LAB — COMPLETE METABOLIC PANEL WITH GFR
AG Ratio: 1.6 (calc) (ref 1.0–2.5)
ALBUMIN MSPROF: 4.1 g/dL (ref 3.6–5.1)
ALT: 11 U/L (ref 6–29)
AST: 15 U/L (ref 10–30)
Alkaline phosphatase (APISO): 59 U/L (ref 33–115)
BUN: 10 mg/dL (ref 7–25)
CALCIUM: 9.3 mg/dL (ref 8.6–10.2)
CO2: 25 mmol/L (ref 20–32)
CREATININE: 0.88 mg/dL (ref 0.50–1.10)
Chloride: 103 mmol/L (ref 98–110)
GFR, Est African American: 105 mL/min/{1.73_m2} (ref >=60)
GFR, Est Non African American: 91 mL/min/{1.73_m2} (ref >=60)
GLUCOSE: 98 mg/dL (ref 65–99)
Globulin: 2.6 g/dL (calc) (ref 1.9–3.7)
Potassium: 3.9 mmol/L (ref 3.5–5.3)
Sodium: 137 mmol/L (ref 135–146)
Total Bilirubin: 0.8 mg/dL (ref 0.2–1.2)
Total Protein: 6.7 g/dL (ref 6.1–8.1)

## 2017-01-17 LAB — CBC WITH DIFFERENTIAL/PLATELET
BASOS ABS: 73 {cells}/uL (ref 0–200)
Basophils Relative: 1.1 %
Eosinophils Absolute: 132 cells/uL (ref 15–500)
Eosinophils Relative: 2 %
HCT: 41.2 % (ref 35.0–45.0)
Hemoglobin: 13.8 g/dL (ref 11.7–15.5)
Lymphs Abs: 2904 cells/uL (ref 850–3900)
MCH: 28.1 pg (ref 27.0–33.0)
MCHC: 33.5 g/dL (ref 32.0–36.0)
MCV: 83.9 fL (ref 80.0–100.0)
MONOS PCT: 10.4 %
MPV: 13.1 fL — ABNORMAL HIGH (ref 7.5–12.5)
Neutro Abs: 2805 cells/uL (ref 1500–7800)
Neutrophils Relative %: 42.5 %
PLATELETS: 363 10*3/uL (ref 140–400)
RBC: 4.91 10*6/uL (ref 3.80–5.10)
RDW: 13 % (ref 11.0–15.0)
TOTAL LYMPHOCYTE: 44 %
WBC: 6.6 10*3/uL (ref 3.8–10.8)
WBCMIX: 686 {cells}/uL (ref 200–950)

## 2017-01-17 LAB — IRON,TIBC AND FERRITIN PANEL
%SAT: 14 % (calc) (ref 11–50)
FERRITIN: 6 ng/mL — AB (ref 10–154)
Iron: 88 ug/dL (ref 40–190)
TIBC: 633 mcg/dL (calc) — ABNORMAL HIGH (ref 250–450)

## 2017-01-17 LAB — TSH: TSH: 1.97 m[IU]/L

## 2017-01-17 MED ORDER — DROSPIRENONE-ETHINYL ESTRADIOL 3-0.02 MG PO TABS: 1.0000 | ORAL_TABLET | Freq: Every day | ORAL | 3 refills | Status: DC

## 2017-01-17 NOTE — Patient Instructions (Signed)
Keep up a regular exercise program and make sure you are eating a healthy diet Try to eat 4 servings of dairy a day, or if you are lactose intolerant take a calcium with vitamin D daily.  Your vaccines are up to date.   

## 2017-01-17 NOTE — Progress Notes (Signed)
Subjective:     Betty Larson is a 26 y.o. female and is here for a comprehensive physical exam. The patient reports problems - fatigue.She's happy with her current birth control regimen.  She is getting married in November.    Social History   Social History  . Marital status: Single    Spouse name: N/A  . Number of children: N/A  . Years of education: N/A   Occupational History  . Nanny Student   Social History Main Topics  . Smoking status: Never Smoker  . Smokeless tobacco: Never Used  . Alcohol use No  . Drug use: No  . Sexual activity: Yes    Partners: Male   Other Topics Concern  . Not on file   Social History Narrative   No regular exercise. 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   Health Maintenance  Topic Date Due  . HIV Screening  09/07/2005  . PAP SMEAR  11/02/2016  . INFLUENZA VACCINE  11/24/2016  . TETANUS/TDAP  10/06/2024    The following portions of the patient's history were reviewed and updated as appropriate: allergies, current medications, past family history, past medical history, past social history, past surgical history and problem list.  Review of Systems A comprehensive review of systems was negative.   Objective:    BP 113/66   Pulse 80   Wt 116 lb (52.6 kg)   SpO2 100%   BMI 21.22 kg/m  General appearance: alert, cooperative and appears stated age Head: Normocephalic, without obvious abnormality, atraumatic Eyes: conj clear, EOMI, pEERLA Ears: normal TM's and external ear canals both ears Nose: Nares normal. Septum midline. Mucosa normal. No drainage or sinus tenderness. Throat: lips, mucosa, and tongue normal; teeth and gums normal Neck: no adenopathy, no carotid bruit, no JVD, supple, symmetrical, trachea midline and thyroid not enlarged, symmetric, no tenderness/mass/nodules Back: symmetric, no curvature. ROM normal. No CVA tenderness. Lungs: clear to auscultation  bilaterally Breasts: Inspection negative,   Heart: regular rate and rhythm, S1, S2 normal, no murmur, click, rub or gallop Abdomen: soft, non-tender; bowel sounds normal; no masses,  no organomegaly Pelvic: cervix normal in appearance, external genitalia normal, no adnexal masses or tenderness, no cervical motion tenderness, rectovaginal septum normal, uterus normal size, shape, and consistency and vagina normal without discharge Extremities: extremities normal, atraumatic, no cyanosis or edema Pulses: 2+ and symmetric Skin: Skin color, texture, turgor normal. No rashes or lesions Lymph nodes: Cervical, supraclavicular, and axillary nodes normal. Neurologic: Alert and oriented X 3, normal strength and tone. Normal symmetric reflexes. Normal coordination and gait    Assessment:    Healthy female exam.      Plan:     See After Visit Summary for Counseling Recommendations   Keep up a regular exercise program and make sure you are eating a healthy diet Try to eat 4 servings of dairy a day, or if you are lactose intolerant take a calcium with vitamin D daily.  Your vaccines are up to date.   Fatigue-she would like to be tested for anemia. She says her father and grandmother recently diagnosed with low iron. She feels like she sleeps excessively and has felt more fatigued and she should fill for the last couple of years.  Breast nodule - likely a cyst.

## 2017-01-20 LAB — CYTOLOGY - PAP
CHLAMYDIA, DNA PROBE: NEGATIVE
Diagnosis: NEGATIVE
HPV: NOT DETECTED
Neisseria Gonorrhea: NEGATIVE

## 2017-01-20 NOTE — Progress Notes (Signed)
Call patient: Your Pap smear is normal. Repeat in 3 years.

## 2017-02-03 ENCOUNTER — Ambulatory Visit
Admission: RE | Admit: 2017-02-03 | Discharge: 2017-02-03 | Disposition: A | Discharge status: Home / Self Care | Payer: No Typology Code available for payment source | Source: Ambulatory Visit | Attending: Family Medicine | Admitting: Family Medicine

## 2017-02-03 DIAGNOSIS — N63 Unspecified lump in unspecified breast: Secondary | ICD-10-CM

## 2017-08-05 ENCOUNTER — Encounter: Payer: Self-pay | Admitting: Emergency Medicine

## 2017-08-05 ENCOUNTER — Other Ambulatory Visit: Payer: Self-pay

## 2017-08-05 ENCOUNTER — Emergency Department
Admission: EM | Admit: 2017-08-05 | Discharge: 2017-08-05 | Disposition: A | Discharge status: Home / Self Care | Payer: Self-pay | Source: Home / Self Care

## 2017-08-05 DIAGNOSIS — R0789 Other chest pain: Secondary | ICD-10-CM

## 2017-08-05 MED ORDER — DICLOFENAC SODIUM 75 MG PO TBEC: 75.0000 mg | DELAYED_RELEASE_TABLET | Freq: Two times a day (BID) | ORAL | 0 refills | Status: DC

## 2017-08-05 NOTE — Discharge Instructions (Addendum)
If pain worsens, becomes associated with fever or shortness of breath, or you develop new symptoms, please return for further evaluation

## 2017-08-05 NOTE — ED Triage Notes (Signed)
Patient reports cough for past month; 4 days ago began having pinching sensations in clavical areas intermittently and last night experienced tightness in chest; no shortness of breath.

## 2017-08-05 NOTE — ED Provider Notes (Signed)
[facility]   [id number] 08/05/17 Arrival Time: 1616   SUBJECTIVE:  Betty Larson is a 27 y.o. female who presents to the urgent care with complaint of chest and arm pain for 3 days.  Patient notes a cough that has been present for about a month but is been getting better for the last week.  She has no history of asthma.  There is no nausea or vomiting, leg swelling or leg pain, abdominal symptoms such as pain or nausea.  Patient states that ibuprofen does help some.  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  No past medical history on file. Family History  Problem Relation Age of Onset  . Breast cancer Maternal Grandmother        breast  . Diabetes Maternal Grandfather    Social History   Socioeconomic History  . Marital status: Single    Spouse name: Not on file  . Number of children: Not on file  . Years of education: Not on file  . Highest education level: Not on file  Occupational History  . Occupation: Oncologistanny    Employer: Student  Social Needs  . Financial resource strain: Not on file  . Food insecurity:    Worry: Not on file    Inability: Not on file  . Transportation needs:    Medical: Not on file    Non-medical: Not on file  Tobacco Use  . Smoking status: Never Smoker  . Smokeless tobacco: Never Used  Substance and Sexual Activity  . Alcohol use: No  . Drug use: No  . Sexual activity: Yes    Partners: Male  Lifestyle  . Physical activity:    Days per week: Not on file    Minutes per session: Not on file  . Stress: Not on file  Relationships  . Social connections:    Talks on phone: Not on file    Gets together: Not on file    Attends religious service: Not on file    Active member of club or organization: Not on file    Attends meetings of clubs or organizations: Not on file    Relationship status: Not on file  . Intimate partner violence:    Fear of current or ex partner: Not on file    Emotionally abused:  Not on file    Physically abused: Not on file    Forced sexual activity: Not on file  Other Topics Concern  . Not on file  Social History Narrative   No regular exercise. 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   No outpatient medications have been marked as taking for the 08/05/17 encounter [facility](Hospital Encounter).   No Known Allergies    ROS: As per HPI, remainder of ROS negative.   OBJECTIVE:   There were no vitals filed for this visit.   General appearance: alert; no distress Eyes: PERRL; EOMI; conjunctiva normal HENT: normocephalic; atraumatic;  oral mucosa normal Neck: supple Lungs: clear to auscultation bilaterally; nontender chest  Heart: regular rate and rhythm Abdomen: soft, non-tender; bowel sounds normal; no masses or organomegaly; no guarding or rebound tenderness Back: no CVA tenderness Extremities: no cyanosis or edema; symmetrical with no gross deformities Skin: warm and dry Neurologic: normal gait; grossly normal Psychological: alert and cooperative; normal mood and affect      Labs:  Results for orders placed or performed in visit on 01/17/17  COMPLETE  METABOLIC PANEL WITH GFR  Result Value Ref Range   Glucose, Bld 98 65 - 99 mg/dL   BUN 10 7 - 25 mg/dL   Creat 1.61 0.96 - 0.45 mg/dL   GFR, Est Non African American 91 > OR = 60 mL/min/1.76m2   GFR, Est African American 105 > OR = 60 mL/min/1.3m2   BUN/Creatinine Ratio NOT APPLICABLE 6 - 22 (calc)   Sodium 137 135 - 146 mmol/L   Potassium 3.9 3.5 - 5.3 mmol/L   Chloride 103 98 - 110 mmol/L   CO2 25 20 - 32 mmol/L   Calcium 9.3 8.6 - 10.2 mg/dL   Total Protein 6.7 6.1 - 8.1 g/dL   Albumin 4.1 3.6 - 5.1 g/dL   Globulin 2.6 1.9 - 3.7 g/dL (calc)   AG Ratio 1.6 1.0 - 2.5 (calc)   Total Bilirubin 0.8 0.2 - 1.2 mg/dL   Alkaline phosphatase (APISO) 59 33 - 115 U/L   AST 15 10 - 30 U/L   ALT 11 6 - 29 U/L  Lipid Panel w/reflex Direct LDL  Result  Value Ref Range   Cholesterol 183 <200 mg/dL   HDL 72 >40 mg/dL   Triglycerides 981 (H) <150 mg/dL   LDL Cholesterol (Calc) 85 mg/dL (calc)   Total CHOL/HDL Ratio 2.5 <5.0 (calc)   Non-HDL Cholesterol (Calc) 111 <130 mg/dL (calc)  CBC w/Diff  Result Value Ref Range   WBC 6.6 3.8 - 10.8 Thousand/uL   RBC 4.91 3.80 - 5.10 Million/uL   Hemoglobin 13.8 11.7 - 15.5 g/dL   HCT 19.1 47.8 - 29.5 %   MCV 83.9 80.0 - 100.0 fL   MCH 28.1 27.0 - 33.0 pg   MCHC 33.5 32.0 - 36.0 g/dL   RDW 62.1 30.8 - 65.7 %   Platelets 363 140 - 400 Thousand/uL   MPV 13.1 (H) 7.5 - 12.5 fL   Neutro Abs 2,805 1,500 - 7,800 cells/uL   Lymphs Abs 2,904 850 - 3,900 cells/uL   WBC mixed population 686 200 - 950 cells/uL   Eosinophils Absolute 132 15 - 500 cells/uL   Basophils Absolute 73 0 - 200 cells/uL   Neutrophils Relative % 42.5 %   Total Lymphocyte 44.0 %   Monocytes Relative 10.4 %   Eosinophils Relative 2.0 %   Basophils Relative 1.1 %  TSH  Result Value Ref Range   TSH 1.97 mIU/L  Fe+TIBC+Fer  Result Value Ref Range   Iron 88 40 - 190 mcg/dL   TIBC 846 (H) 962 - 952 mcg/dL (calc)   %SAT 14 11 - 50 % (calc)   Ferritin 6 (L) 10 - 154 ng/mL  Cytology - PAP  Result Value Ref Range   Adequacy      Satisfactory for evaluation  endocervical/transformation zone component PRESENT.   Diagnosis      NEGATIVE FOR INTRAEPITHELIAL LESIONS OR MALIGNANCY. BENIGN REACTIVE/REPARATIVE CHANGES.   Chlamydia Negative    Neisseria gonorrhea Negative    HPV NOT DETECTED    Material Submitted CervicoVaginal Pap [ThinPrep Imaged]     Labs Reviewed - No data to display  No results found.     ASSESSMENT & PLAN:  1. Chest wall discomfort    If pain worsens, becomes associated with fever or shortness of breath, or you develop new symptoms, please return for further evaluation Meds ordered this encounter  Medications  . diclofenac (VOLTAREN) 75 MG EC tablet    Sig: Take 1 tablet (75 mg total) by mouth  2  (two) times daily.    Dispense:  14 tablet    Refill:  0    Reviewed expectations re: course of current medical issues. Questions answered. Outlined signs and symptoms indicating need for more acute intervention. Patient verbalized understanding. After Visit Summary given.    Procedures:      Elvina Sidle, MD 08/05/17 [phone]

## 2017-08-31 ENCOUNTER — Encounter: Payer: Self-pay | Admitting: Physician Assistant

## 2017-08-31 ENCOUNTER — Ambulatory Visit (INDEPENDENT_AMBULATORY_CARE_PROVIDER_SITE_OTHER): Payer: Self-pay

## 2017-08-31 ENCOUNTER — Ambulatory Visit (INDEPENDENT_AMBULATORY_CARE_PROVIDER_SITE_OTHER): Payer: Self-pay | Admitting: Physician Assistant

## 2017-08-31 VITALS — BP 113/71 | HR 88 | Wt 126.0 lb

## 2017-08-31 DIAGNOSIS — R002 Palpitations: Secondary | ICD-10-CM

## 2017-08-31 DIAGNOSIS — R0789 Other chest pain: Secondary | ICD-10-CM

## 2017-08-31 DIAGNOSIS — R790 Abnormal level of blood mineral: Secondary | ICD-10-CM

## 2017-08-31 LAB — D-DIMER, QUANTITATIVE (NOT AT ARMC): D DIMER QUANT: 0.29 ug{FEU}/mL (ref <0.50)

## 2017-08-31 NOTE — Patient Instructions (Signed)
Palpitations A palpitation is the feeling that your heartbeat is irregular or is faster than normal. It may feel like your heart is fluttering or skipping a beat. Palpitations are usually not a serious problem. They may be caused by many things, including smoking, caffeine, alcohol, stress, and certain medicines. Although most causes of palpitations are not serious, palpitations can be a sign of a serious medical problem. In some cases, you may need further medical evaluation. Follow these instructions at home: Pay attention to any changes in your symptoms. Take these actions to help with your condition:  Avoid the following: ? Caffeinated coffee, tea, soft drinks, diet pills, and energy drinks. ? Chocolate. ? Alcohol.  Do not use any tobacco products, such as cigarettes, chewing tobacco, and e-cigarettes. If you need help quitting, ask your health care provider.  Try to reduce your stress and anxiety. Things that can help you relax include: ? Yoga. ? Meditation. ? Physical activity, such as swimming, jogging, or walking. ? Biofeedback. This is a method that helps you learn to use your mind to control things in your body, such as your heartbeats.  Get plenty of rest and sleep.  Take over-the-counter and prescription medicines only as told by your health care provider.  Keep all follow-up visits as told by your health care provider. This is important.  Contact a health care provider if:  You continue to have a fast or irregular heartbeat after 24 hours.  Your palpitations occur more often. Get help right away if:  You have chest pain or shortness of breath.  You have a severe headache.  You feel dizzy or you faint. This information is not intended to replace advice given to you by your health care provider. Make sure you discuss any questions you have with your health care provider. Document Released: 04/09/2000 Document Revised: 09/15/2015 Document Reviewed: 12/26/2014 Elsevier  Interactive Patient Education  2018 Elsevier Inc.     Cardiac Event Monitoring A cardiac event monitor is a small recording device that is used to detect abnormal heart rhythms (arrhythmias). The monitor is used to record your heart rhythm when you have symptoms, such as:  Fast heartbeats (palpitations), such as heart racing or fluttering.  Dizziness.  Fainting or light-headedness.  Unexplained weakness.  Some monitors are wired to electrodes placed on your chest. Electrodes are flat, sticky disks that attach to your skin. Other monitors may be hand-held or worn on the wrist. The monitor can be worn for up to 30 days. If the monitor is attached to your chest, a technician will prepare your chest for the electrode placement and show you how to work the monitor. Take time to practice using the monitor before you leave the office. Make sure you understand how to send the information from the monitor to your health care provider. In some cases, you may need to use a landline telephone instead of a cell phone. What are the risks? Generally, this device is safe to use, but it possible that the skin under the electrodes will become irritated. How to use your cardiac event monitor  Wear your monitor at all times, except when you are in water: ? Do not let the monitor get wet. ? Take the monitor off when you bathe. Do not swim or use a hot tub with it on.  Keep your skin clean. Do not put body lotion or moisturizer on your chest.  Change the electrodes as told by your health care provider or any time they   stop sticking to your skin. You may need to use medical tape to keep them on.  Try to put the electrodes in slightly different places on your chest to help prevent skin irritation. They must remain in the area under your left breast and in the upper right section of your chest.  Make sure the monitor is safely clipped to your clothing or in a location close to your body that your health care  provider recommends.  Press the button to record as soon as you feel heart-related symptoms, such as: ? Dizziness. ? Weakness. ? Light-headedness. ? Palpitations. ? Thumping or pounding in your chest. ? Shortness of breath. ? Unexplained weakness.  Keep a diary of your activities, such as walking, doing chores, and taking medicine. It is very important to note what you were doing when you pushed the button to record your symptoms. This will help your health care provider determine what might be contributing to your symptoms.  Send the recorded information as recommended by your health care provider. It may take some time for your health care provider to process the results.  Change the batteries as told by your health care provider.  Keep electronic devices away from your monitor. This includes: ? Tablets. ? MP3 players. ? Cell phones.  While wearing your monitor you should avoid: ? Electric blankets. ? Electric razors. ? Electric toothbrushes. ? Microwave ovens. ? Magnets. ? Metal detectors. Get help right away if:  You have chest pain.  You have extreme difficulty breathing or shortness of breath.  You develop a very fast heartbeat that persists.  You develop dizziness that does not go away.  You faint or constantly feel like you are about to faint. Summary  A cardiac event monitor is a small recording device that is used to help detect abnormal heart rhythms (arrhythmias).  The monitor is used to record your heart rhythm when you have heart-related symptoms.  Make sure you understand how to send the information from the monitor to your health care provider.  It is important to press the button on the monitor when you have any heart-related symptoms.  Keep a diary of your activities, such as walking, doing chores, and taking medicine. It is very important to note what you were doing when you pushed the button to record your symptoms. This will help your health care  provider learn what might be causing your symptoms. This information is not intended to replace advice given to you by your health care provider. Make sure you discuss any questions you have with your health care provider. Document Released: 01/20/2008 Document Revised: 03/27/2016 Document Reviewed: 03/27/2016 Elsevier Interactive Patient Education  2017 Elsevier Inc.   

## 2017-08-31 NOTE — Progress Notes (Signed)
HPI:                                                                Betty Larson is a 27 y.o. female who presents to [facility] today for chest pain and palpitations  This a pleasant 27 yo healthy F presenting with 6 weeks of atypical chest pain and 2.5 weeks of heart palpitations. Chest pain is random, affects alternating sides of the chest wall, radiates to both axilla, lasts for minutes, described as a pinch and a burn. Associated with heart fluttering. Reports constant heart palpitations 2.5 weeks ago. Reports she is aware of her heat beat all of the time and feels like it is in her throat constantly. Denies symptoms with exercise. Denies constitutional symptoms, cough, dyspnea, wheezing. Reports Ibuprofen helps with the chest pain.  Reports developing a cough in March that lasted approximately 6 weeks and resolved on its own. She also went on a domestic flight during this time.  Patient was seen in urgent care approx 4 weeks ago for the same symptoms. Diagnosed with chest wall pain and prescribed Diclofenac.   No flowsheet data found.    No past medical history on file. Past Surgical History:  Procedure Laterality Date  . WISDOM TOOTH EXTRACTION     Social History   Tobacco Use  . Smoking status: Never Smoker  . Smokeless tobacco: Never Used  Substance Use Topics  . Alcohol use: No   family history includes Breast cancer in her maternal grandmother; Diabetes in her maternal grandfather.    ROS: negative except as noted in the HPI  Medications: Current Outpatient Medications  Medication Sig Dispense Refill  . drospirenone-ethinyl estradiol (GIANVI) 3-0.02 MG tablet Take 1 tablet by mouth daily. 3 Package 3   No current facility-administered medications for this visit.    No Known Allergies     Objective:  BP 113/71   Pulse 88   Wt 126 lb (57.2 kg)   BMI 20.34 kg/m  Gen:  alert, not ill-appearing, no distress,  appropriate for age HEENT: head normocephalic without obvious abnormality, conjunctiva and cornea clear, trachea midline Pulm: Normal work of breathing, normal phonation, clear to auscultation bilaterally, no wheezes, rales or rhonchi CV: Normal rate, regular rhythm, s1 and s2 distinct, no murmurs, clicks or rubs; radial pulses 2+ symmetric Neuro: alert and oriented x 3, no tremor MSK: extremities atraumatic, normal gait and station Skin: intact, no rashes on exposed skin, no jaundice, no cyanosis Psych: well-groomed, cooperative, good eye contact, euthymic mood, affect mood-congruent, speech is articulate, and thought processes clear and goal-directed    No results found for this or any previous visit (from the past 72 hour(s)). No results found.    Assessment and Plan: 27 y.o. female with   Chest pain, atypical - Plan: DG Chest 2 View, D-dimer, quantitative (not at [facility]), CBC with Differential/Platelet, Comprehensive metabolic panel, Iron, TIBC and Ferritin Panel  Heart palpitations - Plan: DG Chest 2 View, Holter monitor - 48 hour  Low serum ferritin level - Plan: CBC with Differential/Platelet, Iron, TIBC and Ferritin Panel  - atypical, poorly localized chest wall pain for 1 month with new onset heart palpitations for 2 weeks. Low clinical suspicion for PE, but given  history of OCP and flight around the time of symptom onset, will check d-dimer. High likelihood of anemia given low ferritin and high TIBC, rechecking levels. Chest x-ray and holter monitor.    Patient education and anticipatory guidance given Patient agrees with treatment plan Follow-up in 2 weeks or sooner as needed if symptoms worsen or fail to improve  Levonne Hubert PA-C

## 2017-09-01 LAB — COMPREHENSIVE METABOLIC PANEL
AG RATIO: 1.4 (calc) (ref 1.0–2.5)
ALT: 12 U/L (ref 6–29)
AST: 16 U/L (ref 10–30)
Albumin: 3.9 g/dL (ref 3.6–5.1)
Alkaline phosphatase (APISO): 65 U/L (ref 33–115)
BILIRUBIN TOTAL: 0.6 mg/dL (ref 0.2–1.2)
BUN: 10 mg/dL (ref 7–25)
CALCIUM: 9.2 mg/dL (ref 8.6–10.2)
CO2: 24 mmol/L (ref 20–32)
Chloride: 102 mmol/L (ref 98–110)
Creat: 0.71 mg/dL (ref 0.50–1.10)
Globulin: 2.7 g/dL (calc) (ref 1.9–3.7)
Glucose, Bld: 128 mg/dL — ABNORMAL HIGH (ref 65–99)
Potassium: 4.1 mmol/L (ref 3.5–5.3)
SODIUM: 138 mmol/L (ref 135–146)
TOTAL PROTEIN: 6.6 g/dL (ref 6.1–8.1)

## 2017-09-01 LAB — CBC WITH DIFFERENTIAL/PLATELET
BASOS ABS: 50 {cells}/uL (ref 0–200)
Basophils Relative: 1.1 %
EOS PCT: 2 %
Eosinophils Absolute: 90 cells/uL (ref 15–500)
HEMATOCRIT: 38.6 % (ref 35.0–45.0)
HEMOGLOBIN: 13.1 g/dL (ref 11.7–15.5)
LYMPHS ABS: 1562 {cells}/uL (ref 850–3900)
MCH: 28.2 pg (ref 27.0–33.0)
MCHC: 33.9 g/dL (ref 32.0–36.0)
MCV: 83 fL (ref 80.0–100.0)
MPV: 13 fL — ABNORMAL HIGH (ref 7.5–12.5)
Monocytes Relative: 10.6 %
Neutro Abs: 2322 cells/uL (ref 1500–7800)
Neutrophils Relative %: 51.6 %
Platelets: 312 10*3/uL (ref 140–400)
RBC: 4.65 10*6/uL (ref 3.80–5.10)
RDW: 12.9 % (ref 11.0–15.0)
Total Lymphocyte: 34.7 %
WBC mixed population: 477 cells/uL (ref 200–950)
WBC: 4.5 10*3/uL (ref 3.8–10.8)

## 2017-09-01 LAB — IRON,TIBC AND FERRITIN PANEL
%SAT: 10 % — AB (ref 11–50)
Ferritin: 6 ng/mL — ABNORMAL LOW (ref 10–154)
Iron: 58 ug/dL (ref 40–190)
TIBC: 600 mcg/dL (calc) — ABNORMAL HIGH (ref 250–450)

## 2017-09-01 NOTE — Progress Notes (Signed)
D-dimer is negative which rules out pulmonary embolism Hemoglobin has decreased from 13.8 to 13.1, iron levels and iron stores have also decreased. I would recommend iron supplementation. SlowFe or Gentle Iron twice a day on Mondays, Wednesdays, and Fridays. Take with orange juice or calcium-rich food. Chest x-ray is normal

## 2017-09-14 ENCOUNTER — Ambulatory Visit: Payer: Self-pay | Admitting: Physician Assistant

## 2017-09-20 ENCOUNTER — Ambulatory Visit (INDEPENDENT_AMBULATORY_CARE_PROVIDER_SITE_OTHER): Payer: Self-pay

## 2017-09-20 DIAGNOSIS — R002 Palpitations: Secondary | ICD-10-CM

## 2017-09-23 ENCOUNTER — Ambulatory Visit (INDEPENDENT_AMBULATORY_CARE_PROVIDER_SITE_OTHER): Payer: Self-pay | Admitting: Physician Assistant

## 2017-09-23 ENCOUNTER — Encounter: Payer: Self-pay | Admitting: Physician Assistant

## 2017-09-23 VITALS — BP 121/77 | HR 62 | Wt 127.0 lb

## 2017-09-23 DIAGNOSIS — R790 Abnormal level of blood mineral: Secondary | ICD-10-CM

## 2017-09-23 MED ORDER — FERROUS SULFATE 325 (65 FE) MG PO TBEC: DELAYED_RELEASE_TABLET | ORAL | 11 refills | Status: DC

## 2017-09-23 NOTE — Patient Instructions (Signed)
Iron-Rich Diet Iron is a mineral that helps your body to produce hemoglobin. Hemoglobin is a protein in your red blood cells that carries oxygen to your body's tissues. Eating too little iron may cause you to feel weak and tired, and it can increase your risk for infection. Eating enough iron is necessary for your body's metabolism, muscle function, and nervous system. Iron is naturally found in many foods. It can also be added to foods or fortified in foods. There are two types of dietary iron:  Heme iron. Heme iron is absorbed by the body more easily than nonheme iron. Heme iron is found in meat, poultry, and fish.  Nonheme iron. Nonheme iron is found in dietary supplements, iron-fortified grains, beans, and vegetables.  You may need to follow an iron-rich diet if:  You have been diagnosed with iron deficiency or iron-deficiency anemia.  You have a condition that prevents you from absorbing dietary iron, such as: ? Infection in your intestines. ? Celiac disease. This involves long-lasting (chronic) inflammation of your intestines.  You do not eat enough iron.  You eat a diet that is high in foods that impair iron absorption.  You have lost a lot of blood.  You have heavy bleeding during your menstrual cycle.  You are pregnant.  What is my plan? Your health care provider may help you to determine how much iron you need per day based on your condition. Generally, when a person consumes sufficient amounts of iron in the diet, the following iron needs are met:  Men. ? 14-18 years old: 11 mg per day. ? 19-50 years old: 8 mg per day.  Women. ? 14-18 years old: 15 mg per day. ? 19-50 years old: 18 mg per day. ? Over 50 years old: 8 mg per day. ? Pregnant women: 27 mg per day. ? Breastfeeding women: 9 mg per day.  What do I need to know about an iron-rich diet?  Eat fresh fruits and vegetables that are high in vitamin C along with foods that are high in iron. This will help  increase the amount of iron that your body absorbs from food, especially with foods containing nonheme iron. Foods that are high in vitamin C include oranges, peppers, tomatoes, and mango.  Take iron supplements only as directed by your health care provider. Overdose of iron can be life-threatening. If you were prescribed iron supplements, take them with orange juice or a vitamin C supplement.  Cook foods in pots and pans that are made from iron.  Eat nonheme iron-containing foods alongside foods that are high in heme iron. This helps to improve your iron absorption.  Certain foods and drinks contain compounds that impair iron absorption. Avoid eating these foods in the same meal as iron-rich foods or with iron supplements. These include: ? Coffee, black tea, and red wine. ? Milk, dairy products, and foods that are high in calcium. ? Beans, soybeans, and peas. ? Whole grains.  When eating foods that contain both nonheme iron and compounds that impair iron absorption, follow these tips to absorb iron better. ? Soak beans overnight before cooking. ? Soak whole grains overnight and drain them before using. ? Ferment flours before baking, such as using yeast in bread dough. What foods can I eat? Grains Iron-fortified breakfast cereal. Iron-fortified whole-wheat bread. Enriched rice. Sprouted grains. Vegetables Spinach. Potatoes with skin. Green peas. Broccoli. Red and green bell peppers. Fermented vegetables. Fruits Prunes. Raisins. Oranges. Strawberries. Mango. Grapefruit. Meats and Other Protein Sources   Beef liver. Oysters. Beef. Shrimp. [place]. Chicken. [place]. Sardines. Chickpeas. Nuts. Tofu. Beverages Tomato juice. Fresh orange juice. Prune juice. Hibiscus tea. Fortified instant breakfast shakes. Condiments Tahini. Fermented soy sauce. Sweets and Desserts Black-strap molasses. Other Wheat germ. The items listed above may not be a complete list of recommended foods or beverages.  Contact your dietitian for more options. What foods are not recommended? Grains Whole grains. Bran cereal. Bran flour. Oats. Vegetables Artichokes. Brussels sprouts. Kale. Fruits Blueberries. Raspberries. Strawberries. Figs. Meats and Other Protein Sources Soybeans. Products made from soy protein. Dairy Milk. Cream. Cheese. Yogurt. Cottage cheese. Beverages Coffee. Black tea. Red wine. Sweets and Desserts Cocoa. Chocolate. Ice cream. Other Basil. Oregano. Parsley. The items listed above may not be a complete list of foods and beverages to avoid. Contact your dietitian for more information. This information is not intended to replace advice given to you by your health care provider. Make sure you discuss any questions you have with your health care provider. Document Released: 11/24/2004 Document Revised: 10/31/2015 Document Reviewed: 11/07/2013 Elsevier Interactive Patient Education  [employer].

## 2017-09-23 NOTE — Progress Notes (Signed)
HPI:                                                                Betty Larson is a 27 y.o. female who presents to [facility] today for follow-up chest pain/palpitations  Interval history: reports since starting iron supplement 2 weeks ago her symptoms have resolved entirely. She denies menorrhagia, currently on OCP and has light menses lasting 3 days. She completed cardiac event monitor as well and results are still pending.  This a pleasant 27 yo healthy F presenting with 6 weeks of atypical chest pain and 2.5 weeks of heart palpitations. Chest pain is random, affects alternating sides of the chest wall, radiates to both axilla, lasts for minutes, described as a pinch and a burn. Associated with heart fluttering. Reports constant heart palpitations 2.5 weeks ago. Reports she is aware of her heat beat all of the time and feels like it is in her throat constantly. Denies symptoms with exercise. Denies constitutional symptoms, cough, dyspnea, wheezing. Reports Ibuprofen helps with the chest pain.   Depression screen [facility] 2/9 09/23/2017 01/17/2017  Decreased Interest 0 0  Down, Depressed, Hopeless 0 0  PHQ - 2 Score 0 0  Altered sleeping - 1  Tired, decreased energy - 3  Change in appetite - 0  Feeling bad or failure about yourself  - 0  Trouble concentrating - 0  Moving slowly or fidgety/restless - 0  Suicidal thoughts - 0  PHQ-9 Score - 4    No flowsheet data found.    History reviewed. No pertinent past medical history. Past Surgical History:  Procedure Laterality Date  . WISDOM TOOTH EXTRACTION     Social History   Tobacco Use  . Smoking status: Never Smoker  . Smokeless tobacco: Never Used  Substance Use Topics  . Alcohol use: No   family history includes Breast cancer in her maternal grandmother; Diabetes in her maternal grandfather.    ROS: negative except as noted in the HPI  Medications: Current Outpatient  Medications  Medication Sig Dispense Refill  . drospirenone-ethinyl estradiol (GIANVI) 3-0.02 MG tablet Take 1 tablet by mouth daily. 3 Package 3  . ferrous sulfate 325 (65 FE) MG EC tablet 1 tab bid on Mondays, Wednesdays, Fridays 90 tablet 11   No current facility-administered medications for this visit.    No Known Allergies     Objective:  BP 121/77   Pulse 62   Wt 127 lb (57.6 kg)   BMI 20.50 kg/m  Gen:  alert, not ill-appearing, no distress, appropriate for age HEENT: head normocephalic without obvious abnormality, conjunctiva and cornea clear, trachea midline Pulm: Normal work of breathing, normal phonation Neuro: alert and oriented x 3, no tremor MSK: extremities atraumatic, normal gait and station Skin: intact, no rashes on exposed skin, no jaundice, no cyanosis Psych: well-groomed, cooperative, good eye contact, euthymic mood, affect mood-congruent, speech is articulate, and thought processes clear and goal-directed    No results found for this or any previous visit (from the past 72 hour(s)). No results found.    Assessment and Plan: 27 y.o. female with   Low serum ferritin level - Plan: Iron, TIBC and Ferritin Panel, ferrous sulfate 325 (65 FE) MG EC tablet  Lab Results  Component Value Date   HGB 13.1 08/31/2017   Lab Results  Component Value Date   IRON 58 08/31/2017   TIBC 600 (H) 08/31/2017   FERRITIN 6 (L) 08/31/2017   Low ferritin without anemia Patient has been on oral iron supplement for 2 weeks Menses are already controlled with OCP Plan to recheck iron studies in 4 weeks Recommend Ferritin level >40 Once at goal, she can reduce iron supplementation to her menstrual cycle and follow iron-rich diet  Negative depression screen  Patient education and anticipatory guidance given Patient agrees with treatment plan Follow-up as needed if symptoms worsen or fail to improve  Levonne Hubert PA-C

## 2017-09-29 ENCOUNTER — Encounter: Payer: Self-pay | Admitting: Physician Assistant

## 2017-09-29 DIAGNOSIS — I493 Ventricular premature depolarization: Secondary | ICD-10-CM | POA: Insufficient documentation

## 2017-09-29 HISTORY — DX: Ventricular premature depolarization: I49.3

## 2017-09-29 NOTE — Progress Notes (Signed)
Heart monitor did not show any serious arrhythmia There were occasional early beats called PVC's and PAC's If these are very symptomatic, they can be managed with a low-dose beta blocker Other recommendations would be to limit caffeine/stimulants

## 2017-10-26 LAB — IRON,TIBC AND FERRITIN PANEL
%SAT: 16 % (calc) (ref 16–45)
Ferritin: 10 ng/mL — ABNORMAL LOW (ref 16–154)
Iron: 83 ug/dL (ref 40–190)
TIBC: 532 mcg/dL (calc) — ABNORMAL HIGH (ref 250–450)

## 2018-01-18 ENCOUNTER — Ambulatory Visit (INDEPENDENT_AMBULATORY_CARE_PROVIDER_SITE_OTHER): Payer: Self-pay | Admitting: Physician Assistant

## 2018-01-18 ENCOUNTER — Encounter: Payer: Self-pay | Admitting: Physician Assistant

## 2018-01-18 VITALS — BP 133/76 | HR 96 | Ht 66.0 in | Wt 126.0 lb

## 2018-01-18 DIAGNOSIS — R350 Frequency of micturition: Secondary | ICD-10-CM

## 2018-01-18 DIAGNOSIS — Z87440 Personal history of urinary (tract) infections: Secondary | ICD-10-CM

## 2018-01-18 DIAGNOSIS — R300 Dysuria: Secondary | ICD-10-CM

## 2018-01-18 LAB — POCT URINALYSIS DIPSTICK
Bilirubin, UA: NEGATIVE
GLUCOSE UA: NEGATIVE
Ketones, UA: NEGATIVE
LEUKOCYTES UA: NEGATIVE
NITRITE UA: NEGATIVE
PROTEIN UA: NEGATIVE
RBC UA: NEGATIVE
Spec Grav, UA: <=1.005 — AB (ref 1.010–1.025)
Urobilinogen, UA: 0.2 E.U./dL
pH, UA: 6.5 (ref 5.0–8.0)

## 2018-01-18 MED ORDER — NITROFURANTOIN MONOHYD MACRO 100 MG PO CAPS: 100.0000 mg | ORAL_CAPSULE | Freq: Two times a day (BID) | ORAL | 0 refills | Status: DC

## 2018-01-18 MED ORDER — FLUCONAZOLE 150 MG PO TABS: 150.0000 mg | ORAL_TABLET | Freq: Once | ORAL | 0 refills | Status: AC

## 2018-01-18 NOTE — Progress Notes (Signed)
Subjective:    Patient ID: Betty Larson, female    DOB: March 29, 1991, 27 y.o.   MRN: [id number]  HPI  Pt is a 27 yo female with hx of recurrent UTI's who presents to the clinic with urinary frequency and urgency that started 2-3 days ago. No fever, chills, abdominal or flank pain. She does have some tenderness over suprapubic area.  Patient has seen urologist in the past and given tips on how to prevent urinary tract infections and suggested that she may have some overactive bladder as well.  She never took the medication for overactive bladder.  She denies any vaginal discharge, vaginal itching.  She is in a relationship with her husband only.  They do not use condoms. She does admit that last night and this morning she took her grandmother's clindamycin and her symptoms do seem to be improving today.  .. Active Ambulatory Problems    Diagnosis Date Noted  . Chest pain, atypical 08/31/2017  . Heart palpitations 08/31/2017  . Low serum ferritin level 08/31/2017  . 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 Dysuria 08/19/2011   No Additional Past Medical History      Review of Systems  All other systems reviewed and are negative.      Objective:   Physical Exam  Constitutional: She is oriented to person, place, and time. She appears well-developed and well-nourished.  HENT:  Head: Normocephalic and atraumatic.  Cardiovascular: Normal rate and regular rhythm.  Pulmonary/Chest: Effort normal and breath sounds normal.  No CVA tenderness.   Abdominal: Soft. Bowel sounds are normal. She exhibits no distension and no mass. There is no rebound and no guarding.  Mild suprapubic tenderness.   Neurological: She is alert and oriented to person, place, and time.  Skin: Skin is warm. No rash noted.  Psychiatric: She has a normal mood and affect. Her behavior is normal.          Assessment & Plan:  Marland KitchenMarland KitchenDiagnoses and all orders for this  visit:  Urine frequency -     POCT Urinalysis Dipstick -     Urine Culture  Dysuria -     nitrofurantoin, macrocrystal-monohydrate, (MACROBID) 100 MG capsule; Take 1 capsule (100 mg total) by mouth 2 (two) times daily.  History of UTI  Other orders -     fluconazole (DIFLUCAN) 150 MG tablet; Take 1 tablet (150 mg total) by mouth once for 1 dose. Repeat if symptoms persist in 48-72 hours.    Results for orders placed or performed in visit on 01/18/18  POCT Urinalysis Dipstick  Result Value Ref Range   Color, UA yellow    Clarity, UA clear    Glucose, UA Negative Negative   Bilirubin, UA negative    Ketones, UA negative    Spec Grav, UA <=1.005 (A) 1.010 - 1.025   Blood, UA negative    pH, UA 6.5 5.0 - 8.0   Protein, UA Negative Negative   Urobilinogen, UA 0.2 0.2 or 1.0 E.U./dL   Nitrite, UA negative    Leukocytes, UA Negative Negative   Appearance     Odor     Pt did take a clindamycin last night and this morning. Will culture. She reports hx of UTI's and this feels just like them. Will treat today with macrobid. Will give diflucan for yeast after.  HO given for prevention.  Pt declined STD screening due to low suspicion and cost due to  no insurance.  Diflucan given for yeast after abx use.

## 2018-01-18 NOTE — Patient Instructions (Signed)

## 2018-01-19 ENCOUNTER — Encounter: Payer: Self-pay | Admitting: Physician Assistant

## 2018-01-19 LAB — URINE CULTURE
MICRO NUMBER: 91155057
RESULT: NO GROWTH
SPECIMEN QUALITY: ADEQUATE

## 2018-01-20 NOTE — Progress Notes (Signed)
Call pt: urine culture did not grow any bacteria. How are your symptoms?

## 2018-02-20 ENCOUNTER — Other Ambulatory Visit: Payer: Self-pay | Admitting: Family Medicine

## 2018-02-22 ENCOUNTER — Other Ambulatory Visit: Payer: Self-pay | Admitting: Family Medicine

## 2018-03-07 ENCOUNTER — Ambulatory Visit: Payer: Self-pay | Admitting: Family Medicine

## 2018-03-27 ENCOUNTER — Encounter: Payer: Self-pay | Admitting: Family Medicine

## 2018-03-27 ENCOUNTER — Ambulatory Visit (INDEPENDENT_AMBULATORY_CARE_PROVIDER_SITE_OTHER): Payer: Self-pay | Admitting: Family Medicine

## 2018-03-27 VITALS — BP 124/66 | HR 78 | Ht 66.0 in | Wt 126.0 lb

## 2018-03-27 DIAGNOSIS — Z3041 Encounter for surveillance of contraceptive pills: Secondary | ICD-10-CM

## 2018-03-27 DIAGNOSIS — D509 Iron deficiency anemia, unspecified: Secondary | ICD-10-CM

## 2018-03-27 MED ORDER — DROSPIRENONE-ETHINYL ESTRADIOL 3-0.02 MG PO TABS: 1.0000 | ORAL_TABLET | Freq: Every day | ORAL | 3 refills | Status: DC

## 2018-03-27 NOTE — Progress Notes (Signed)
   Subjective:    Patient ID: Betty Larson, female    DOB: [birth year], 27 y.o.   MRN: [id number]  HPI   27 year old female is here today to follow-up on contraceptive counseling.  She is actually doing really well on her birth control regimen she is happy with that she has not had any problems or side effects.  Iron deficiency-she is still taking her supplement on Monday Wednesdays and Fridays.  Is been a few months that she had her iron levels checked.  Her ferritin was still low at last check.  Review of Systems     Objective:   Physical Exam  Constitutional: She is oriented to person, place, and time. She appears well-developed and well-nourished.  HENT:  Head: Normocephalic and atraumatic.  Right Ear: External ear normal.  Left Ear: External ear normal.  Nose: Nose normal.  TMs and canals are clear. OP is mildly erythematous  Eyes: Pupils are equal, round, and reactive to light. Conjunctivae and EOM are normal.  Neck: Neck supple. No thyromegaly present.  Cardiovascular: Normal rate, regular rhythm and normal heart sounds.  Pulmonary/Chest: Effort normal and breath sounds normal. She has no wheezes.  Lymphadenopathy:    She has no cervical adenopathy.  Neurological: She is alert and oriented to person, place, and time.  Skin: Skin is warm and dry.  Psychiatric: She has a normal mood and affect.       Assessment & Plan:  Contraception surveillance-she is doing well on her regimen.  Blood pressures at goal.  Refilled for 1 year.  Pap smear is up-to-date.  She is now married and declines STD testing.  Iron def - due to recheck labs.  Lab slip printed and stamped as self-pay.

## 2018-03-27 NOTE — Progress Notes (Deleted)
Subjective:     Betty Larson is a 27 y.o. female and is here for a comprehensive physical exam. The patient reports no problems.  Social History   Socioeconomic History  . Marital status: Married    Spouse name: Not on file  . Number of children: Not on file  . Years of education: Not on file  . Highest education level: Not on file  Occupational History  . Occupation: Oncologistanny    Employer: Student  Social Needs  . Financial resource strain: Not on file  . Food insecurity:    Worry: Not on file    Inability: Not on file  . Transportation needs:    Medical: Not on file    Non-medical: Not on file  Tobacco Use  . Smoking status: Never Smoker  . Smokeless tobacco: Never Used  Substance and Sexual Activity  . Alcohol use: No  . Drug use: No  . Sexual activity: Yes    Partners: Male  Lifestyle  . Physical activity:    Days per week: Not on file    Minutes per session: Not on file  . Stress: Not on file  Relationships  . Social connections:    Talks on phone: Not on file    Gets together: Not on file    Attends religious service: Not on file    Active member of club or organization: Not on file    Attends meetings of clubs or organizations: Not on file    Relationship status: Not on file  . Intimate partner violence:    Fear of current or ex partner: Not on file    Emotionally abused: Not on file    Physically abused: Not on file    Forced sexual activity: Not on file  Other Topics Concern  . Not on file  Social History Narrative   No regular exercise. 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   Health Maintenance  Topic Date Due  . HIV Screening  09/07/2005  . INFLUENZA VACCINE  11/24/2017  . PAP SMEAR  01/18/2020  . TETANUS/TDAP  10/06/2024    The following portions of the patient's history were reviewed and updated as appropriate: allergies, current medications, past family history, past medical history, past social  history, past surgical history and problem list.  Review of Systems A comprehensive review of systems was negative.   Objective:    There were no vitals taken for this visit. General appearance: alert, cooperative and appears stated age Head: Normocephalic, without obvious abnormality, atraumatic Eyes: conj clear, EOMI, PEERLA Ears: normal TM's and external ear canals both ears Nose: Nares normal. Septum midline. Mucosa normal. No drainage or sinus tenderness. Throat: lips, mucosa, and tongue normal; teeth and gums normal Neck: no adenopathy, no carotid bruit, no JVD, supple, symmetrical, trachea midline and thyroid not enlarged, symmetric, no tenderness/mass/nodules Back: symmetric, no curvature. ROM normal. No CVA tenderness. Lungs: clear to auscultation bilaterally Breasts: normal appearance, no masses or tenderness Heart: regular rate and rhythm, S1, S2 normal, no murmur, click, rub or gallop Abdomen: soft, non-tender; bowel sounds normal; no masses,  no organomegaly Pelvic: {pelvic exam:16852::"cervix normal in appearance","external genitalia normal","no adnexal masses or tenderness","no cervical motion tenderness","rectovaginal septum normal","uterus normal size, shape, and consistency","vagina normal without discharge"} Extremities: extremities normal, atraumatic, no cyanosis or edema Pulses: 2+ and symmetric Skin: Skin color, texture, turgor normal. No rashes or lesions Lymph nodes: Cervical, supraclavicular, and axillary nodes normal.  Neurologic: Alert and oriented X 3, normal strength and tone. Normal symmetric reflexes. Normal coordination and gait    Assessment:    Healthy female exam.      Plan:     See After Visit Summary for Counseling Recommendations   Keep up a regular exercise program and make sure you are eating a healthy diet Try to eat 4 servings of dairy a day, or if you are lactose intolerant take a calcium with vitamin D daily.  Your vaccines are up to  date.

## 2019-03-01 ENCOUNTER — Other Ambulatory Visit: Payer: Self-pay

## 2019-03-01 ENCOUNTER — Emergency Department
Admission: EM | Admit: 2019-03-01 | Discharge: 2019-03-01 | Discharge status: Absent without leave | Payer: Self-pay | Source: Home / Self Care

## 2019-03-01 ENCOUNTER — Emergency Department (INDEPENDENT_AMBULATORY_CARE_PROVIDER_SITE_OTHER)
Admission: EM | Admit: 2019-03-01 | Discharge: 2019-03-01 | Disposition: A | Discharge status: Home / Self Care | Payer: Self-pay | Source: Home / Self Care

## 2019-03-01 ENCOUNTER — Encounter: Payer: Self-pay | Admitting: *Deleted

## 2019-03-01 DIAGNOSIS — R300 Dysuria: Secondary | ICD-10-CM

## 2019-03-01 DIAGNOSIS — N3001 Acute cystitis with hematuria: Secondary | ICD-10-CM

## 2019-03-01 LAB — POCT URINALYSIS DIP (MANUAL ENTRY)
Bilirubin, UA: NEGATIVE
Glucose, UA: NEGATIVE mg/dL
Ketones, POC UA: NEGATIVE mg/dL
Nitrite, UA: POSITIVE — AB
Protein Ur, POC: NEGATIVE mg/dL
Spec Grav, UA: 1.01 (ref 1.010–1.025)
Urobilinogen, UA: 0.2 E.U./dL
pH, UA: 6.5 (ref 5.0–8.0)

## 2019-03-01 MED ORDER — CEPHALEXIN 500 MG PO CAPS: 500.0000 mg | ORAL_CAPSULE | Freq: Two times a day (BID) | ORAL | 0 refills | Status: DC

## 2019-03-01 NOTE — ED Triage Notes (Signed)
Pt c/o dysuria x 1 day. Denies fever.  

## 2019-03-01 NOTE — ED Provider Notes (Addendum)
[facility]    CSN: [id number] Arrival date & time: 03/01/19  1503      History   Chief Complaint Chief Complaint  Patient presents with  . Dysuria    HPI Betty Larson is a 28 y.o. female.   HPI Betty Larson is a 28 y.o. female presenting to UC with c/o dysuria and mild hematuria that started yesterday. Last UTI was a in March 2020. Symptoms feel similar. Mild bladder discomfort. Denies fever, chills, n/v/d. No medications tried PTA.   Past Medical History:  Diagnosis Date  . 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 Heart palpitations 08/31/2017  . Low serum ferritin level 08/31/2017    Past Surgical History:  Procedure Laterality Date  . WISDOM TOOTH EXTRACTION      OB History   No obstetric history on file.      Home Medications    Prior to Admission medications   Medication Sig Start Date End Date Taking? Authorizing Provider  cephALEXin (KEFLEX) 500 MG capsule Take 1 capsule (500 mg total) by mouth 2 (two) times daily. 03/01/19   Lurene Shadow, PA-C  drospirenone-ethinyl estradiol (NIKKI) 3-0.02 MG tablet Take 1 tablet by mouth daily. 03/27/18   Agapito Games, MD  ferrous sulfate 325 (65 FE) MG EC tablet 1 tab bid on Mondays, Wednesdays, Fridays 09/23/17   Carlis Stable, PA-C    Family History Family History  Problem Relation Age of Onset  . Breast cancer Maternal Grandmother        breast  . Diabetes Maternal Grandfather     Social History Social History   Tobacco Use  . Smoking status: Never Smoker  . Smokeless tobacco: Never Used  Substance Use Topics  . Alcohol use: Yes    Comment: occ  . Drug use: No     Allergies   Patient has no known allergies.   Review of Systems Review of Systems  Constitutional: Negative for chills and fever.  Gastrointestinal: Negative for abdominal pain, diarrhea, nausea and vomiting.   Genitourinary: Positive for dysuria, frequency and hematuria. Negative for decreased urine volume, flank pain, pelvic pain, urgency, vaginal bleeding, vaginal discharge and vaginal pain.     Physical Exam Triage Vital Signs ED Triage Vitals [03/01/19 1528]  Enc Vitals Group     BP 126/81     Pulse Rate 73     Resp 18     Temp 98.5 F (36.9 C)     Temp Source Oral     SpO2 99 %     Weight 126 lb (57.2 kg)     Height 5\' 6"  (1.676 m)     Head Circumference      Peak Flow      Pain Score 0     Pain Loc      Pain Edu?      Excl. in GC?    No data found.  Updated Vital Signs BP 126/81 (BP Location: Right Arm)   Pulse 73   Temp 98.5 F (36.9 C) (Oral)   Resp 18   Ht 5\' 6"  (1.676 m)   Wt 126 lb (57.2 kg)   LMP 01/29/2019   SpO2 99%   BMI 20.34 kg/m   Visual Acuity Right Eye Distance:   Left Eye Distance:   Bilateral Distance:    Right Eye Near:   Left Eye Near:    Bilateral  Near:     Physical Exam Vitals signs and nursing note reviewed.  Constitutional:      Appearance: Normal appearance. She is well-developed.  HENT:     Head: Normocephalic and atraumatic.     Mouth/Throat:     Mouth: Mucous membranes are moist.  Neck:     Musculoskeletal: Normal range of motion.  Cardiovascular:     Rate and Rhythm: Normal rate and regular rhythm.  Pulmonary:     Effort: Pulmonary effort is normal. No respiratory distress.     Breath sounds: Normal breath sounds.  Abdominal:     General: There is no distension.     Palpations: Abdomen is soft.     Tenderness: There is no abdominal tenderness. There is no right CVA tenderness or left CVA tenderness.  Musculoskeletal: Normal range of motion.  Skin:    General: Skin is warm and dry.  Neurological:     Mental Status: She is alert and oriented to person, place, and time.  Psychiatric:        Behavior: Behavior normal.      UC Treatments / Results  Labs (all labs ordered are listed, but only abnormal results are  displayed) Labs Reviewed  POCT URINALYSIS DIP (MANUAL ENTRY) - Abnormal; Notable for the following components:      Result Value   Blood, UA large (*)    Nitrite, UA Positive (*)    Leukocytes, UA Moderate (2+) (*)    All other components within normal limits  URINE CULTURE    EKG   Radiology No results found.  Procedures Procedures (including critical care time)  Medications Ordered in UC Medications - No data to display  Initial Impression / Assessment and Plan / UC Course  I have reviewed the triage vital signs and the nursing notes.  Pertinent labs & imaging results that were available during my care of the patient were reviewed by me and considered in my medical decision making (see chart for details).     Hx and UA c/w UTI Culture sent Will start on keflex Pt declined AVS  Final Clinical Impressions(s) / UC Diagnoses   Final diagnoses:  Dysuria  Acute cystitis with hematuria   Discharge Instructions   None    ED Prescriptions    Medication Sig Dispense Auth. Provider   cephALEXin (KEFLEX) 500 MG capsule Take 1 capsule (500 mg total) by mouth 2 (two) times daily. 14 capsule Noe Gens, [place]     PDMP not reviewed this encounter.   Noe Gens, PA-C 03/01/19 [place], [place], [place] 03/01/19 1650

## 2019-03-03 LAB — URINE CULTURE
MICRO NUMBER:: 1069185
Result:: NO GROWTH
SPECIMEN QUALITY:: ADEQUATE

## 2019-04-10 ENCOUNTER — Other Ambulatory Visit: Payer: Self-pay | Admitting: Family Medicine

## 2019-04-10 ENCOUNTER — Telehealth: Payer: Self-pay | Admitting: Family Medicine

## 2019-04-10 NOTE — Telephone Encounter (Signed)
Please schedule her for f/u for birth control thanx!-Per Miss Kenney Houseman. Left voicemail in case pt calls back.

## 2019-05-25 ENCOUNTER — Ambulatory Visit (INDEPENDENT_AMBULATORY_CARE_PROVIDER_SITE_OTHER): Payer: Self-pay | Admitting: Physician Assistant

## 2019-05-25 ENCOUNTER — Other Ambulatory Visit: Payer: Self-pay

## 2019-05-25 VITALS — BP 141/81 | HR 88 | Temp 97.8°F | Ht 66.0 in | Wt 125.0 lb

## 2019-05-25 DIAGNOSIS — R300 Dysuria: Secondary | ICD-10-CM

## 2019-05-25 DIAGNOSIS — Z87440 Personal history of urinary (tract) infections: Secondary | ICD-10-CM

## 2019-05-25 DIAGNOSIS — R350 Frequency of micturition: Secondary | ICD-10-CM

## 2019-05-25 DIAGNOSIS — R310 Gross hematuria: Secondary | ICD-10-CM

## 2019-05-25 LAB — POCT URINALYSIS DIP (CLINITEK)
Bilirubin, UA: NEGATIVE
Blood, UA: NEGATIVE
Glucose, UA: NEGATIVE mg/dL
Ketones, POC UA: NEGATIVE mg/dL
Leukocytes, UA: NEGATIVE
Nitrite, UA: NEGATIVE
POC PROTEIN,UA: NEGATIVE
Spec Grav, UA: 1.01 (ref 1.010–1.025)
Urobilinogen, UA: 0.2 E.U./dL
pH, UA: 7 (ref 5.0–8.0)

## 2019-05-25 MED ORDER — SULFAMETHOXAZOLE-TRIMETHOPRIM 800-160 MG PO TABS: 1.0000 | ORAL_TABLET | Freq: Two times a day (BID) | ORAL | 0 refills | Status: AC

## 2019-05-25 MED ORDER — FLUCONAZOLE 150 MG PO TABS: 150.0000 mg | ORAL_TABLET | Freq: Once | ORAL | 0 refills | Status: AC

## 2019-05-25 NOTE — Patient Instructions (Signed)

## 2019-05-25 NOTE — Progress Notes (Signed)
Subjective:    Patient ID: Betty Larson, female    DOB: 1991/02/11, 29 y.o.   MRN: [id number]  HPI  Pt is a 29 yo female who presents to the clinic to follow up on urinary urgency, frequency, pain, and gross blood that started last weekend. She was not able to come in because her grandfather past away. She denies any fever, chills, flank pain, n/v/d. She started clindamycin that her grandmother had in cabinet but was not a full rx. She feels a lot better just wonders if she needs to be treated completely. No vaginal itching or discharge. Pt has hx of UTI last treated November 2020.   .. Active Ambulatory Problems    Diagnosis Date Noted  . Heart palpitations 08/31/2017  . Low serum ferritin level 08/31/2017  . 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 Dysuria 08/19/2011  . Chest pain, atypical 08/31/2017   No Additional Past Medical History      Review of Systems See HPI.     Objective:   Physical Exam Vitals reviewed.  Constitutional:      Appearance: Normal appearance.  Cardiovascular:     Rate and Rhythm: Normal rate.  Pulmonary:     Effort: Pulmonary effort is normal.  Abdominal:     General: Bowel sounds are normal. There is no distension.     Palpations: Abdomen is soft.     Tenderness: There is no right CVA tenderness or left CVA tenderness.  Neurological:     General: No focal deficit present.     Mental Status: She is alert.  Psychiatric:        Mood and Affect: Mood normal.    .. Results for orders placed or performed in visit on 05/25/19  Urine Culture   Specimen: Urine  Result Value Ref Range   MICRO NUMBER: [id number]    SPECIMEN QUALITY: Adequate    Sample Source URINE    STATUS: FINAL    Result: No Growth   POCT URINALYSIS DIP (CLINITEK)  Result Value Ref Range   Color, UA yellow yellow   Clarity, UA clear clear   Glucose, UA negative negative mg/dL   Bilirubin, UA negative negative   Ketones, POC  UA negative negative mg/dL   Spec Grav, UA 1.010 1.010 - 1.025   Blood, UA negative negative   pH, UA 7.0 5.0 - 8.0   POC PROTEIN,UA negative negative, trace   Urobilinogen, UA 0.2 0.2 or 1.0 E.U./dL   Nitrite, UA Negative Negative   Leukocytes, UA Negative Negative          Assessment & Plan:  Betty Larson KitchenMarland KitchenMalani was seen today for dysuria.  Diagnoses and all orders for this visit:  Burning with urination -     POCT URINALYSIS DIP (CLINITEK) -     Urine Culture -     sulfamethoxazole-trimethoprim (BACTRIM DS) 800-160 MG tablet; Take 1 tablet by mouth 2 (two) times daily for 10 days.  Urinary frequency -     sulfamethoxazole-trimethoprim (BACTRIM DS) 800-160 MG tablet; Take 1 tablet by mouth 2 (two) times daily for 10 days.  History of UTI -     sulfamethoxazole-trimethoprim (BACTRIM DS) 800-160 MG tablet; Take 1 tablet by mouth 2 (two) times daily for 10 days.  Gross hematuria -     sulfamethoxazole-trimethoprim (BACTRIM DS) 800-160 MG tablet; Take 1 tablet by mouth 2 (two) times daily for 10 days.  Other orders -  fluconazole (DIFLUCAN) 150 MG tablet; Take 1 tablet (150 mg total) by mouth once for 1 dose.   UA negative. Will culture. Will give bactrim for 3 days to completely treat. Diflucan for post abx yeast infection. Discussed symptomatic care. No flank pain less likely kidney stone. Follow up as needed.

## 2019-05-26 LAB — URINE CULTURE
MICRO NUMBER:: 10096471
Result:: NO GROWTH
SPECIMEN QUALITY:: ADEQUATE

## 2019-05-28 NOTE — Progress Notes (Signed)
Betty Larson,   No bacteria growth. Prior antibiotic could have treated. Finish new antibiotic. Follow up as needed.   [name]

## 2019-06-26 ENCOUNTER — Other Ambulatory Visit: Payer: Self-pay | Admitting: Family Medicine

## 2019-07-16 ENCOUNTER — Other Ambulatory Visit: Payer: Self-pay | Admitting: Family Medicine

## 2019-07-29 ENCOUNTER — Other Ambulatory Visit: Payer: Self-pay | Admitting: Family Medicine

## 2019-10-13 IMAGING — DX DG CHEST 2V
2 series · 2 of 2 positions shown · non-contrast
Comparison: None.

CLINICAL DATA: Atypical chest pain with palpitations

EXAM:
CHEST - 2 VIEW

[chest pa]
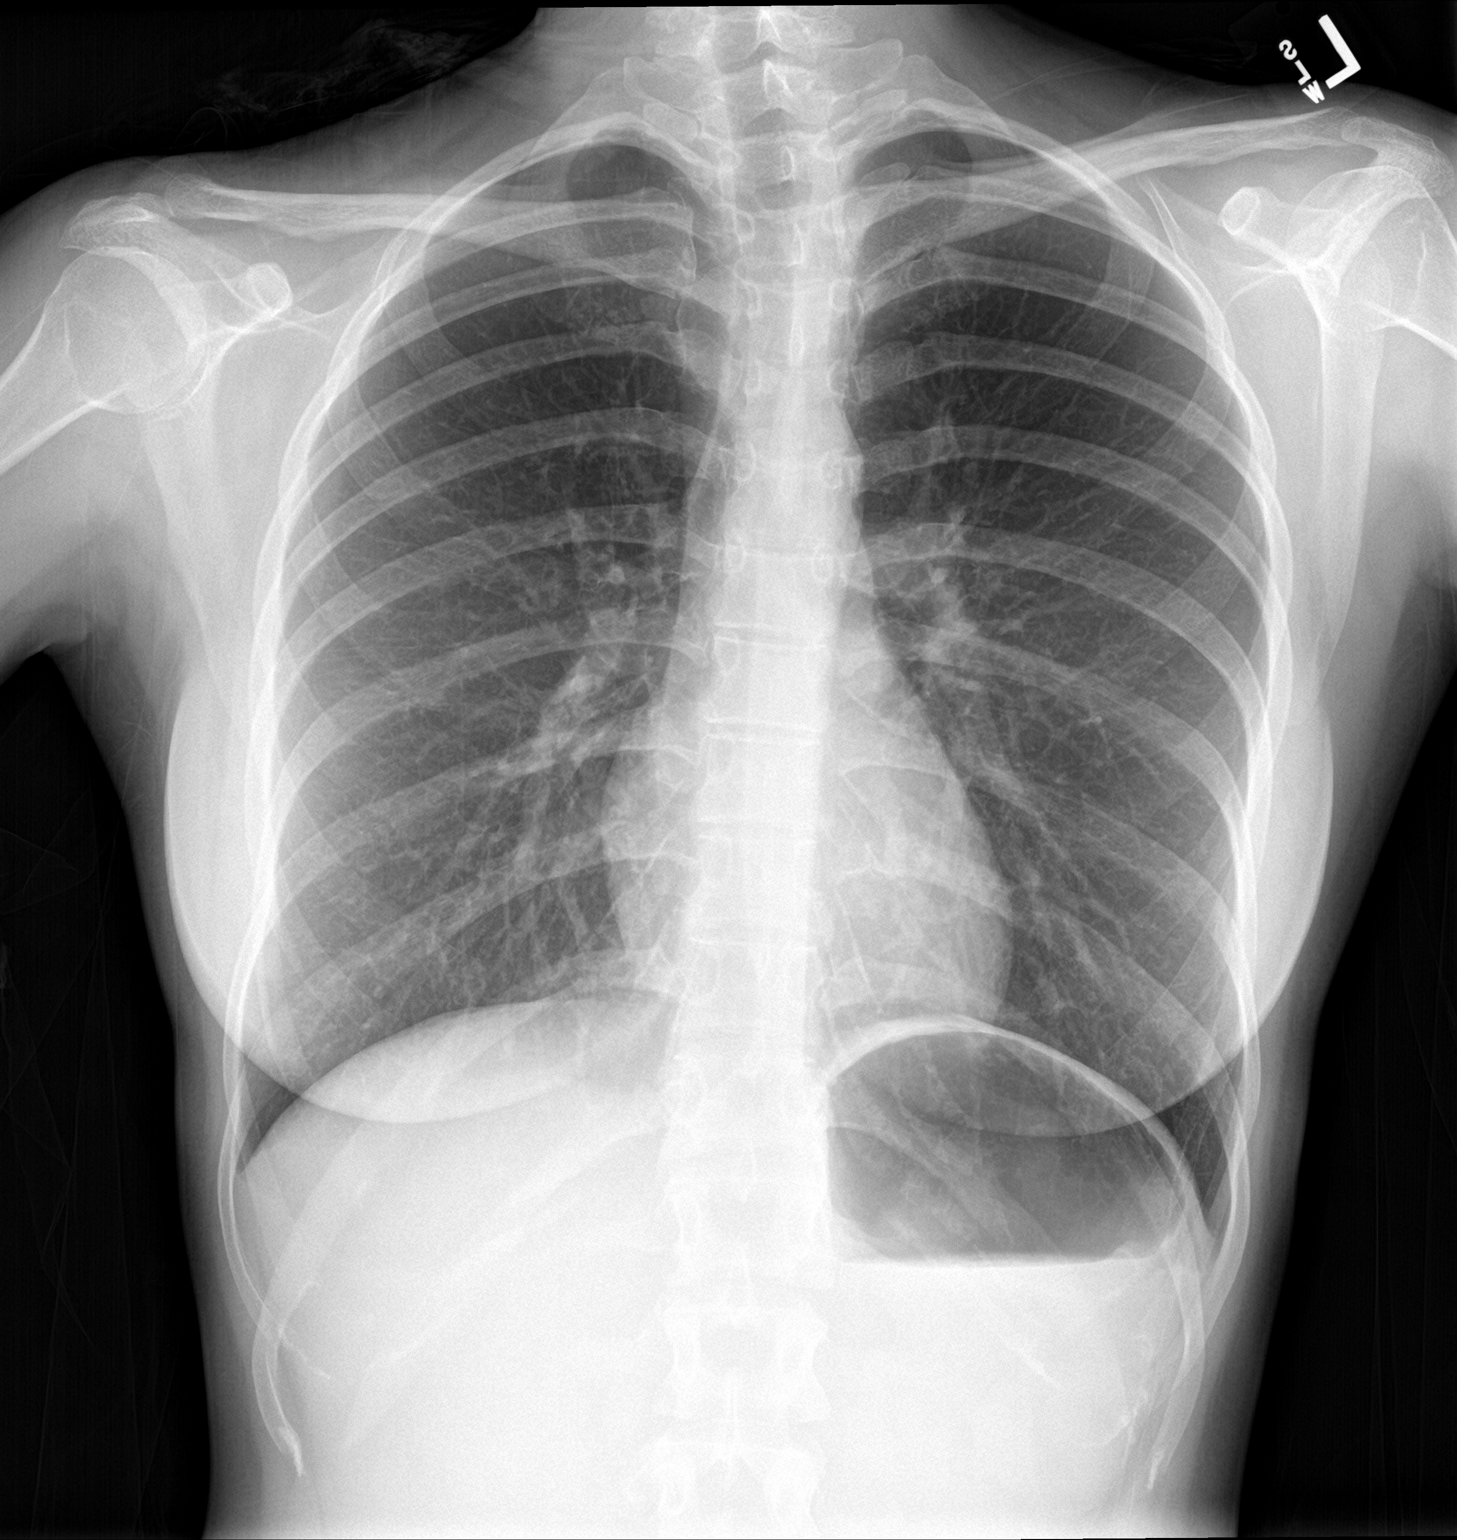

[chest lat]
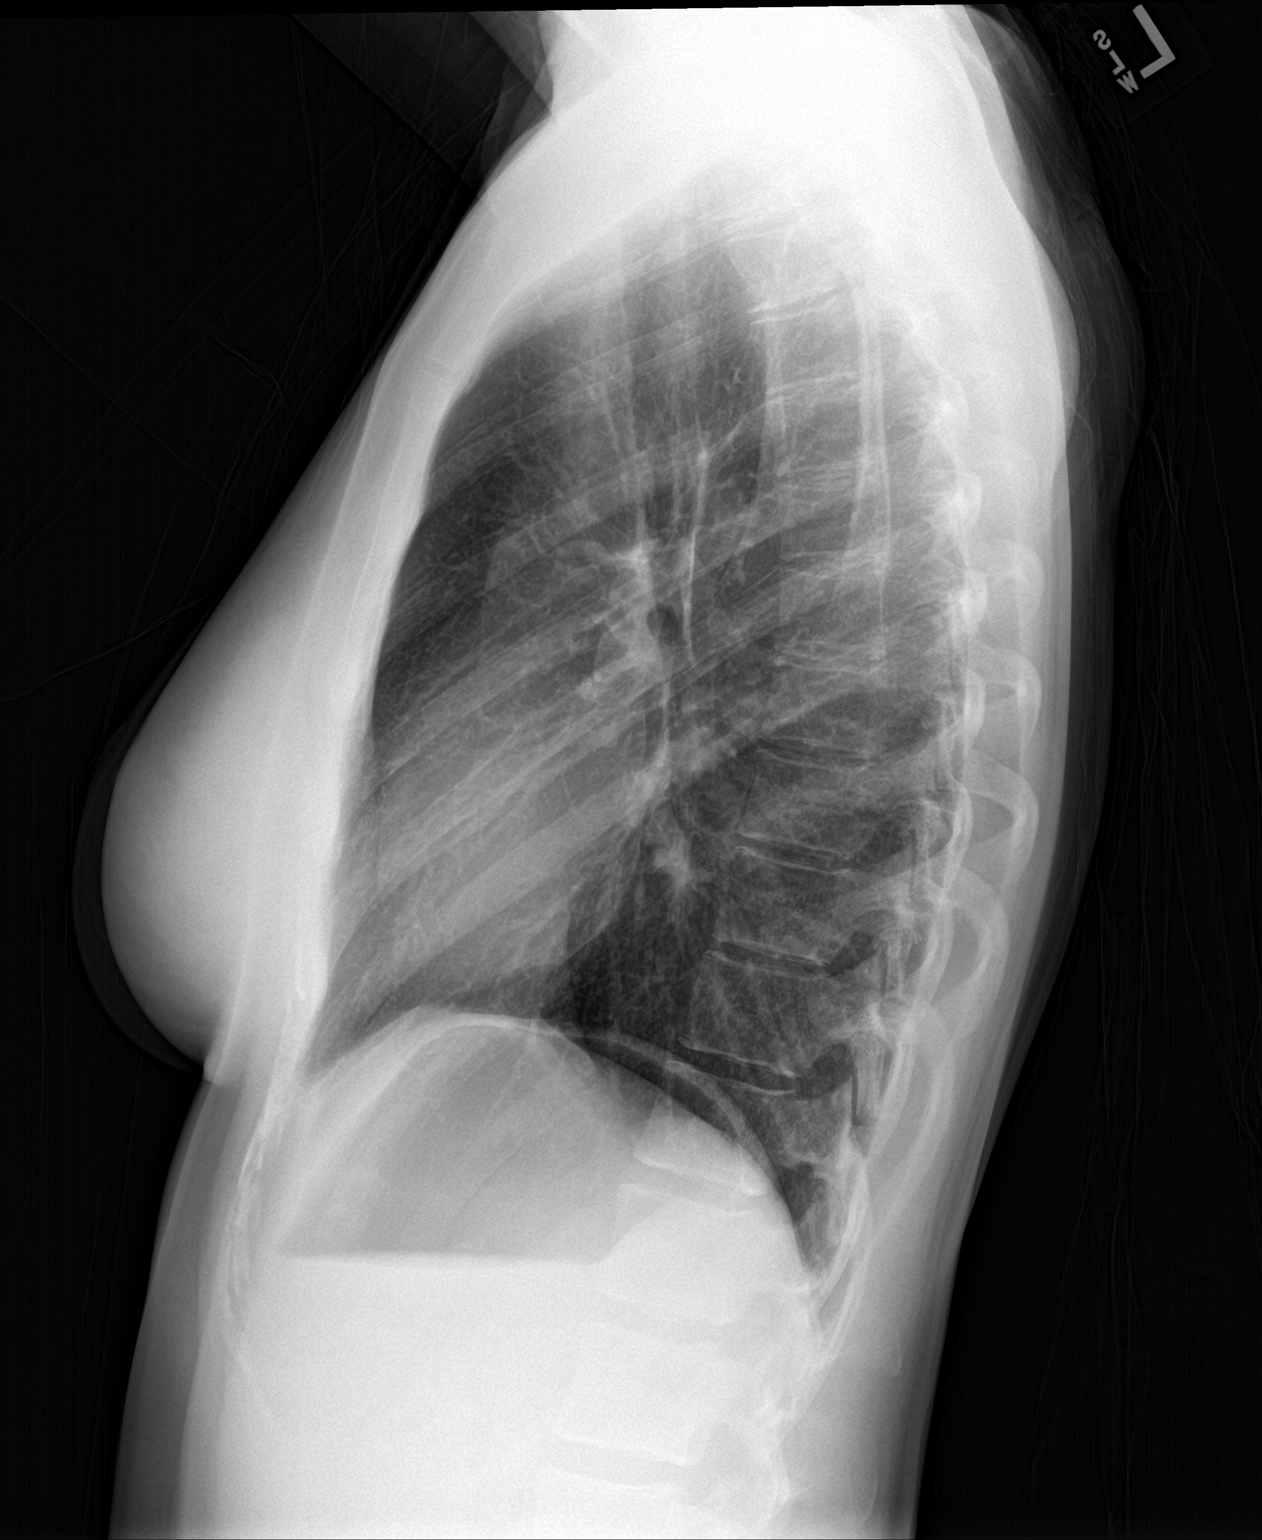

[2 of 2 positions shown; findings below may reference images not displayed]

FINDINGS: Normal heart size and mediastinal contours. No infiltrate or edema.
No effusion or pneumothorax. Mild upper thoracic levocurvature. No
focal osseous finding.
IMPRESSION: Negative chest.

## 2019-10-18 DIAGNOSIS — N390 Urinary tract infection, site not specified: Secondary | ICD-10-CM | POA: Insufficient documentation

## 2019-10-18 DIAGNOSIS — R339 Retention of urine, unspecified: Secondary | ICD-10-CM | POA: Insufficient documentation

## 2019-11-09 ENCOUNTER — Other Ambulatory Visit: Payer: Self-pay

## 2019-11-09 DIAGNOSIS — Z309 Encounter for contraceptive management, unspecified: Secondary | ICD-10-CM

## 2019-11-09 NOTE — Telephone Encounter (Signed)
Patient will run out of pills on Monday, Pap appointment is scheduled for Thursday. Last refill 07/16/2019

## 2019-11-12 MED ORDER — DROSPIRENONE-ETHINYL ESTRADIOL 3-0.02 MG PO TABS: ORAL_TABLET | ORAL | 0 refills | Status: DC

## 2019-11-15 ENCOUNTER — Encounter: Payer: Self-pay | Admitting: Family Medicine

## 2019-11-15 ENCOUNTER — Ambulatory Visit (INDEPENDENT_AMBULATORY_CARE_PROVIDER_SITE_OTHER): Payer: Self-pay | Admitting: Family Medicine

## 2019-11-15 ENCOUNTER — Other Ambulatory Visit (HOSPITAL_COMMUNITY)
Admission: RE | Admit: 2019-11-15 | Discharge: 2019-11-15 | Disposition: A | Discharge status: Home / Self Care | Payer: Self-pay | Source: Ambulatory Visit | Attending: Family Medicine | Admitting: Family Medicine

## 2019-11-15 VITALS — BP 109/61 | HR 84 | Ht 66.0 in | Wt 127.0 lb

## 2019-11-15 DIAGNOSIS — Z309 Encounter for contraceptive management, unspecified: Secondary | ICD-10-CM

## 2019-11-15 DIAGNOSIS — Z124 Encounter for screening for malignant neoplasm of cervix: Secondary | ICD-10-CM | POA: Insufficient documentation

## 2019-11-15 MED ORDER — DROSPIRENONE-ETHINYL ESTRADIOL 3-0.02 MG PO TABS: 1.0000 | ORAL_TABLET | Freq: Every day | ORAL | 3 refills | Status: DC

## 2019-11-15 NOTE — Progress Notes (Signed)
Established Patient Office Visit  Subjective:  Patient ID: Betty Larson, female    DOB: Feb 20, 1991  Age: 29 y.o. MRN: [id number]  CC:  Chief Complaint  Patient presents with  . Gynecologic Exam    HPI Betty Larson presents for pap only.  She is doing well overall.  She is happy with her current birth control and does need refills for a year.  Her periods are regular and she is not been having any pelvic pain or problems or abnormal discharge.  She declines STD testing.  Past Medical History:  Diagnosis Date  . 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 WISDOM TOOTH EXTRACTION      Family History  Problem Relation Age of Onset  . Breast cancer Maternal Grandmother        breast  . Diabetes Maternal Grandfather     Social History   Socioeconomic History  . Marital status: Married    Spouse name: Not on file  . Number of children: Not on file  . Years of education: Not on file  . Highest education level: Not on file  Occupational History  . Occupation: Oncologist: Student  Tobacco Use  . Smoking status: Never Smoker  . Smokeless tobacco: Never Used  Vaping Use  . Vaping Use: Never used  Substance and Sexual Activity  . Alcohol use: Yes    Comment: occ  . Drug use: No  . Sexual activity: Yes    Partners: Male  Other Topics Concern  . Not on file  Social History Narrative   No regular exercise. 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   Social Determinants of Health   Financial Resource Strain:   . Difficulty of Paying Living Expenses:   Food Insecurity:   . Worried About Programme researcher, broadcasting/film/video in the Last Year:   . Barista in the Last Year:   Transportation Needs:   . Freight forwarder (Medical):   Marland Kitchen Lack of Transportation (Non-Medical):   Physical Activity:   . Days of Exercise per Week:   . Minutes of Exercise per Session:   Stress:   . Feeling  of Stress :   Social Connections:   . Frequency of Communication with Friends and Family:   . Frequency of Social Gatherings with Friends and Family:   . Attends Religious Services:   . Active Member of Clubs or Organizations:   . Attends Banker Meetings:   Marland Kitchen Marital Status:   Intimate Partner Violence:   . Fear of Current or Ex-Partner:   . Emotionally Abused:   Marland Kitchen Physically Abused:   . Sexually Abused:     Outpatient Medications Prior to Visit  Medication Sig Dispense Refill  . ferrous sulfate 325 (65 FE) MG EC tablet 1 tab bid on Mondays, Wednesdays, Fridays 90 tablet 11  . tamsulosin (FLOMAX) 0.4 MG CAPS capsule Take by mouth.    . drospirenone-ethinyl estradiol (NIKKI) 3-0.02 MG tablet TAKE 1 TABLET BY MOUTH DAILY **MUST SCHEDULE AND KEEP APPOINTMENT FOR REFILLS** 28 tablet 0   No facility-administered medications prior to visit.    No Known Allergies  ROS Review of Systems    Objective:    Physical Exam Vitals reviewed.  Constitutional:      Appearance: She is well-developed.  HENT:     Head: Normocephalic and atraumatic.  Eyes:     Conjunctiva/sclera: Conjunctivae normal.  Cardiovascular:     Rate and Rhythm: Normal rate.  Pulmonary:     Effort: Pulmonary effort is normal.  Genitourinary:    General: Normal vulva.     Labia:        Right: No rash.        Left: No rash.      Vagina: Normal.     Cervix: Normal.     Uterus: Normal.      Adnexa: Right adnexa normal and left adnexa normal.  Skin:    General: Skin is dry.     Coloration: Skin is not pale.  Neurological:     Mental Status: She is alert and oriented to person, place, and time.  Psychiatric:        Behavior: Behavior normal.     BP (!) 109/61   Pulse 84   Ht 5\' 6"  (1.676 m)   Wt 127 lb (57.6 kg)   LMP 11/12/2019 (Exact Date)   SpO2 100%   BMI 20.50 kg/m  Wt Readings from Last 3 Encounters:  11/15/19 127 lb (57.6 kg)  05/25/19 125 lb (56.7 kg)  03/01/19 126 lb (57.2  kg)     There are no preventive care reminders to display for this patient.  There are no preventive care reminders to display for this patient.  Lab Results  Component Value Date   TSH 1.97 01/17/2017   Lab Results  Component Value Date   WBC 4.5 08/31/2017   HGB 13.1 08/31/2017   HCT 38.6 08/31/2017   MCV 83.0 08/31/2017   PLT 312 08/31/2017   Lab Results  Component Value Date   NA 138 08/31/2017   K 4.1 08/31/2017   CO2 24 08/31/2017   GLUCOSE 128 (H) 08/31/2017   BUN 10 08/31/2017   CREATININE 0.71 08/31/2017   BILITOT 0.6 08/31/2017   AST 16 08/31/2017   ALT 12 08/31/2017   PROT 6.6 08/31/2017   CALCIUM 9.2 08/31/2017   Lab Results  Component Value Date   CHOL 183 01/17/2017   Lab Results  Component Value Date   HDL 72 01/17/2017   Lab Results  Component Value Date   LDLCALC 85 01/17/2017   Lab Results  Component Value Date   TRIG 159 (H) 01/17/2017   Lab Results  Component Value Date   CHOLHDL 2.5 01/17/2017   No results found for: HGBA1C    Assessment & Plan:   Problem List Items Addressed This Visit    None    Visit Diagnoses    Screening for cervical cancer    -  Primary   Relevant Orders   Cytology - PAP   Encounter for contraceptive management, unspecified type       Relevant Medications   drospirenone-ethinyl estradiol (NIKKI) 3-0.02 MG tablet     Time spent 20 minutes in encounter.  Meds ordered this encounter  Medications  . DISCONTD: drospirenone-ethinyl estradiol (NIKKI) 3-0.02 MG tablet    Sig: Take 1 tablet by mouth daily.    Dispense:  84 tablet    Refill:  3  . drospirenone-ethinyl estradiol (NIKKI) 3-0.02 MG tablet    Sig: Take 1 tablet by mouth daily.    Dispense:  84 tablet    Refill:  3    Follow-up: Return in about 1 year (around 11/14/2020) for Wellness Exam with Pap smear .    11/16/2020, MD

## 2019-11-16 LAB — CYTOLOGY - PAP
Comment: NEGATIVE
Diagnosis: NEGATIVE
High risk HPV: NEGATIVE

## 2019-11-19 NOTE — Progress Notes (Signed)
Call patient: Your Pap smear is normal. Repeat in 3 years.

## 2020-01-30 ENCOUNTER — Encounter: Payer: Self-pay | Admitting: Family Medicine

## 2020-01-30 ENCOUNTER — Ambulatory Visit (INDEPENDENT_AMBULATORY_CARE_PROVIDER_SITE_OTHER): Payer: Self-pay | Admitting: Family Medicine

## 2020-01-30 VITALS — BP 124/76 | HR 67 | Temp 97.9°F | Wt 125.0 lb

## 2020-01-30 DIAGNOSIS — N898 Other specified noninflammatory disorders of vagina: Secondary | ICD-10-CM

## 2020-01-30 DIAGNOSIS — R300 Dysuria: Secondary | ICD-10-CM

## 2020-01-30 LAB — POCT URINALYSIS DIP (CLINITEK)
Bilirubin, UA: NEGATIVE
Blood, UA: NEGATIVE
Glucose, UA: NEGATIVE mg/dL
Ketones, POC UA: NEGATIVE mg/dL
Leukocytes, UA: NEGATIVE
Nitrite, UA: NEGATIVE
POC PROTEIN,UA: NEGATIVE
Spec Grav, UA: 1.01 (ref 1.010–1.025)
Urobilinogen, UA: 0.2 E.U./dL
pH, UA: 6 (ref 5.0–8.0)

## 2020-01-30 LAB — WET PREP FOR TRICH, YEAST, CLUE
MICRO NUMBER:: 11038689
Specimen Quality: ADEQUATE

## 2020-01-30 MED ORDER — METRONIDAZOLE 500 MG PO TABS: 500.0000 mg | ORAL_TABLET | Freq: Two times a day (BID) | ORAL | 0 refills | Status: DC

## 2020-01-30 NOTE — Progress Notes (Signed)
Acute Office Visit  Subjective:    Patient ID: Betty Larson, female    DOB: 12-May-1990, 29 y.o.   MRN: [id number]  Chief Complaint  Patient presents with  . Urinary Tract Infection    HPI Patient is in today for vaginal irritation and itching. Started 4 days ago on Sunday.  She does get recurrent UTIs but says this feels a little different she is not having urinary urgency or frequency but she is having some burning with urination only.  No blood in the urine.  No fevers chills or sweats.  She says really just more of a discomfort and occasional itching it feels like it is a little bit more internal she has not noticed a change in vaginal discharge.  She is not noticed any redness, rash or lesions.  Past Medical History:  Diagnosis Date  . 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 WISDOM TOOTH EXTRACTION      Family History  Problem Relation Age of Onset  . Breast cancer Maternal Grandmother        breast  . Diabetes Maternal Grandfather     Social History   Socioeconomic History  . Marital status: Married    Spouse name: Not on file  . Number of children: Not on file  . Years of education: Not on file  . Highest education level: Not on file  Occupational History  . Occupation: Oncologist: Student  Tobacco Use  . Smoking status: Never Smoker  . Smokeless tobacco: Never Used  Vaping Use  . Vaping Use: Never used  Substance and Sexual Activity  . Alcohol use: Yes    Comment: occ  . Drug use: No  . Sexual activity: Yes    Partners: Male  Other Topics Concern  . Not on file  Social History Narrative   No regular exercise. 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   Social Determinants of Health   Financial Resource Strain:   . Difficulty of Paying Living Expenses: Not on file  Food Insecurity:   . Worried About Programme researcher, broadcasting/film/video in the Last Year: Not on file  .  Ran Out of Food in the Last Year: Not on file  Transportation Needs:   . Lack of Transportation (Medical): Not on file  . Lack of Transportation (Non-Medical): Not on file  Physical Activity:   . Days of Exercise per Week: Not on file  . Minutes of Exercise per Session: Not on file  Stress:   . Feeling of Stress : Not on file  Social Connections:   . Frequency of Communication with Friends and Family: Not on file  . Frequency of Social Gatherings with Friends and Family: Not on file  . Attends Religious Services: Not on file  . Active Member of Clubs or Organizations: Not on file  . Attends Banker Meetings: Not on file  . Marital Status: Not on file  Intimate Partner Violence:   . Fear of Current or Ex-Partner: Not on file  . Emotionally Abused: Not on file  . Physically Abused: Not on file  . Sexually Abused: Not on file    Outpatient Medications Prior to Visit  Medication Sig Dispense Refill  . drospirenone-ethinyl estradiol (NIKKI) 3-0.02 MG tablet Take 1 tablet by mouth daily. 84 tablet 3  . ferrous sulfate 325 (65 FE) MG EC tablet  1 tab bid on Mondays, Wednesdays, Fridays 90 tablet 11  . tamsulosin (FLOMAX) 0.4 MG CAPS capsule Take by mouth.     No facility-administered medications prior to visit.    No Known Allergies  Review of Systems     Objective:    Physical Exam Vitals reviewed. Exam conducted with a chaperone present.  Constitutional:      Appearance: She is well-developed.  HENT:     Head: Normocephalic and atraumatic.  Eyes:     Conjunctiva/sclera: Conjunctivae normal.  Cardiovascular:     Rate and Rhythm: Normal rate.  Pulmonary:     Effort: Pulmonary effort is normal.  Genitourinary:    Exam position: Supine.     Labia:        Right: No rash or tenderness.        Left: No rash.      Urethra: No urethral swelling.     Vagina: Normal.     Cervix: Normal.  Skin:    General: Skin is dry.     Coloration: Skin is not pale.   Neurological:     Mental Status: She is alert and oriented to person, place, and time.  Psychiatric:        Behavior: Behavior normal.     BP 124/76 (BP Location: Left Arm, Patient Position: Sitting, Cuff Size: Normal)   Pulse 67   Temp 97.9 F (36.6 C) (Oral)   Wt 125 lb 0.6 oz (56.7 kg)   BMI 20.18 kg/m  Wt Readings from Last 3 Encounters:  01/30/20 125 lb 0.6 oz (56.7 kg)  11/15/19 127 lb (57.6 kg)  05/25/19 125 lb (56.7 kg)    There are no preventive care reminders to display for this patient.  There are no preventive care reminders to display for this patient.   Lab Results  Component Value Date   TSH 1.97 01/17/2017   Lab Results  Component Value Date   WBC 4.5 08/31/2017   HGB 13.1 08/31/2017   HCT 38.6 08/31/2017   MCV 83.0 08/31/2017   PLT 312 08/31/2017   Lab Results  Component Value Date   NA 138 08/31/2017   K 4.1 08/31/2017   CO2 24 08/31/2017   GLUCOSE 128 (H) 08/31/2017   BUN 10 08/31/2017   CREATININE 0.71 08/31/2017   BILITOT 0.6 08/31/2017   AST 16 08/31/2017   ALT 12 08/31/2017   PROT 6.6 08/31/2017   CALCIUM 9.2 08/31/2017   Lab Results  Component Value Date   CHOL 183 01/17/2017   Lab Results  Component Value Date   HDL 72 01/17/2017   Lab Results  Component Value Date   LDLCALC 85 01/17/2017   Lab Results  Component Value Date   TRIG 159 (H) 01/17/2017   Lab Results  Component Value Date   CHOLHDL 2.5 01/17/2017   No results found for: HGBA1C     Assessment & Plan:   Problem List Items Addressed This Visit    None    Visit Diagnoses    Vaginal itching    -  Primary   Relevant Orders   POCT URINALYSIS DIP (CLINITEK) (Completed)   WET PREP FOR TRICH, YEAST, CLUE   Dysuria         Vaginal itching-exam was normal no lesions externally.  Normal physiologic vaginal discharge on exam.  Wet prep sent we will call with results once available.  She starts her period next week.  Dysuria without frequency or urgency.   Urinalysis  was negative.  So we did not send for culture.  No orders of the defined types were placed in this encounter.    Nani Gasser, MD

## 2020-01-30 NOTE — Addendum Note (Signed)
Addended by: Nani Gasser D on: 01/30/2020 04:04 PM   Modules accepted: Orders

## 2020-05-02 ENCOUNTER — Encounter: Payer: Self-pay | Admitting: Family Medicine

## 2021-01-31 ENCOUNTER — Other Ambulatory Visit: Payer: Self-pay | Admitting: Family Medicine

## 2021-01-31 DIAGNOSIS — Z309 Encounter for contraceptive management, unspecified: Secondary | ICD-10-CM

## 2021-04-15 ENCOUNTER — Other Ambulatory Visit: Payer: Self-pay | Admitting: Family Medicine

## 2021-04-15 DIAGNOSIS — Z309 Encounter for contraceptive management, unspecified: Secondary | ICD-10-CM

## 2021-04-15 NOTE — Telephone Encounter (Signed)
Noted. Prescription sent for 1 month.

## 2021-04-15 NOTE — Telephone Encounter (Signed)
Please call patient to schedule office visit for refills on Birth Control Pills.

## 2021-04-15 NOTE — Telephone Encounter (Signed)
LVM for patient to call back to get a f/u appt scheduled with Dr Linford Arnold for further refills on birth control med. AM

## 2021-04-17 ENCOUNTER — Ambulatory Visit: Payer: Self-pay | Admitting: Family Medicine

## 2021-04-30 ENCOUNTER — Other Ambulatory Visit: Payer: Self-pay

## 2021-04-30 ENCOUNTER — Ambulatory Visit (INDEPENDENT_AMBULATORY_CARE_PROVIDER_SITE_OTHER): Payer: Self-pay | Admitting: Family Medicine

## 2021-04-30 ENCOUNTER — Encounter: Payer: Self-pay | Admitting: Family Medicine

## 2021-04-30 DIAGNOSIS — Z309 Encounter for contraceptive management, unspecified: Secondary | ICD-10-CM

## 2021-04-30 MED ORDER — DROSPIRENONE-ETHINYL ESTRADIOL 3-0.02 MG PO TABS: 1.0000 | ORAL_TABLET | Freq: Every day | ORAL | 4 refills | Status: DC

## 2021-04-30 NOTE — Patient Instructions (Signed)
Next Pap smear due in July 2024.

## 2021-04-30 NOTE — Progress Notes (Signed)
Established Patient Office Visit  Subjective:  Patient ID: Betty Larson, female    DOB: [birth year]  Age: 31 y.o. MRN: [id number]  CC:  Chief Complaint  Patient presents with   Medication Refill    Birth control    HPI Betty Larson presents for contraceptive counseling.  She is doing really well overall.  Happy in her current rare marriage.  She is happy with her current birth control.  Declines any STD testing today.  She is not currently working but is helping to take care of her grandparents who are elderly.  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    Past Surgical History:  Procedure Laterality Date   WISDOM TOOTH EXTRACTION      Family History  Problem Relation Age of Onset   Breast cancer Maternal Grandmother        breast   Diabetes Maternal Grandfather     Social History   Socioeconomic History   Marital status: Married    Spouse name: Not on file   Number of children: Not on file   Years of education: Not on file   Highest education level: Not on file  Occupational History   Occupation: Nanny    Employer: Student  Tobacco Use   Smoking status: Never   Smokeless tobacco: Never  Vaping Use   Vaping Use: Never used  Substance and Sexual Activity   Alcohol use: Yes    Comment: occ   Drug use: No   Sexual activity: Yes    Partners: Male  Other Topics Concern   Not on file  Social History Narrative   No regular exercise. 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   Social Determinants of Health   Financial Resource Strain: Not on file  Food Insecurity: Not on file  Transportation Needs: Not on file  Physical Activity: Not on file  Stress: Not on file  Social Connections: Not on file  Intimate Partner Violence: Not on file    Outpatient Medications Prior to Visit  Medication Sig Dispense Refill   Multiple Vitamins-Iron (MULTIVITAMIN PLUS IRON ADULT) TABS Take 1 tablet by  mouth daily.     LORYNA 3-0.02 MG tablet TAKE ONE TABLET BY MOUTH ONE TIME DAILY **MUST CALL DR. FOR APPOINTMENT** 28 tablet 0   ferrous sulfate 325 (65 FE) MG EC tablet 1 tab bid on Mondays, Wednesdays, Fridays 90 tablet 11   metroNIDAZOLE (FLAGYL) 500 MG tablet Take 1 tablet (500 mg total) by mouth 2 (two) times daily. 14 tablet 0   tamsulosin (FLOMAX) 0.4 MG CAPS capsule Take by mouth.     No facility-administered medications prior to visit.    No Known Allergies  ROS Review of Systems    Objective:    Physical Exam Constitutional:      Appearance: Normal appearance. She is well-developed.  HENT:     Head: Normocephalic and atraumatic.  Cardiovascular:     Rate and Rhythm: Normal rate and regular rhythm.     Heart sounds: Normal heart sounds.  Pulmonary:     Effort: Pulmonary effort is normal.     Breath sounds: Normal breath sounds.  Skin:    General: Skin is warm and dry.  Neurological:     Mental Status: She is alert and oriented to person, place, and time.  Psychiatric:        Behavior: Behavior normal.    BP  110/63    Pulse 79    Ht 5\' 6"  (1.676 m)    Wt 127 lb (57.6 kg)    LMP 04/27/2021 (Exact Date)    SpO2 100%    BMI 20.50 kg/m  Wt Readings from Last 3 Encounters:  04/30/21 127 lb (57.6 kg)  01/30/20 125 lb 0.6 oz (56.7 kg)  11/15/19 127 lb (57.6 kg)     There are no preventive care reminders to display for this patient.  There are no preventive care reminders to display for this patient.  Lab Results  Component Value Date   TSH 1.97 01/17/2017   Lab Results  Component Value Date   WBC 4.5 08/31/2017   HGB 13.1 08/31/2017   HCT 38.6 08/31/2017   MCV 83.0 08/31/2017   PLT 312 08/31/2017   Lab Results  Component Value Date   NA 138 08/31/2017   K 4.1 08/31/2017   CO2 24 08/31/2017   GLUCOSE 128 (H) 08/31/2017   BUN 10 08/31/2017   CREATININE 0.71 08/31/2017   BILITOT 0.6 08/31/2017   AST 16 08/31/2017   ALT 12 08/31/2017   PROT 6.6  08/31/2017   CALCIUM 9.2 08/31/2017   Lab Results  Component Value Date   CHOL 183 01/17/2017   Lab Results  Component Value Date   HDL 72 01/17/2017   Lab Results  Component Value Date   LDLCALC 85 01/17/2017   Lab Results  Component Value Date   TRIG 159 (H) 01/17/2017   Lab Results  Component Value Date   CHOLHDL 2.5 01/17/2017   No results found for: HGBA1C    Assessment & Plan:   Problem List Items Addressed This Visit   None Visit Diagnoses     Encounter for contraceptive management, unspecified type       Relevant Medications   drospirenone-ethinyl estradiol (LORYNA) 3-0.02 MG tablet      Doing well overall.  Happy with current regimen.  Blood pressure at goal.  We will refill for 1 year.  Smear is up-to-date.  Meds ordered this encounter  Medications   drospirenone-ethinyl estradiol (LORYNA) 3-0.02 MG tablet    Sig: Take 1 tablet by mouth daily.    Dispense:  84 tablet    Refill:  4    Follow-up: No follow-ups on file.    Betty Lecher, MD

## 2021-12-21 ENCOUNTER — Ambulatory Visit (INDEPENDENT_AMBULATORY_CARE_PROVIDER_SITE_OTHER): Payer: Self-pay | Admitting: Family Medicine

## 2021-12-21 ENCOUNTER — Encounter: Payer: Self-pay | Admitting: Family Medicine

## 2021-12-21 VITALS — BP 129/74 | HR 82 | Ht 66.0 in | Wt 129.0 lb

## 2021-12-21 DIAGNOSIS — R197 Diarrhea, unspecified: Secondary | ICD-10-CM

## 2021-12-21 NOTE — Progress Notes (Signed)
   Acute Office Visit  Subjective:     Patient ID: Betty Larson, female    DOB: 23-Jul-1990, 31 y.o.   MRN: [id number]  Chief Complaint  Patient presents with   Abdominal Pain    HPI Patient is in today for diarrhea.  It started on August 16.  Nobody else has been sick.  Its mostly been loose stools it gets triggered every time she eats.  She has had some lower abdominal cramping but no pain in the upper abdomen.  No nausea vomiting.  No fever or chills.  No blood in the stool.  No recent camping hiking or being in a lake.  She did travel to [place] in June.  No recent antibiotic use.  No history of Crohn's etc. in the family.  ROS      Objective:    BP 129/74   Pulse 82   Ht 5\' 6"  (1.676 m)   Wt 129 lb (58.5 kg)   SpO2 99%   BMI 20.82 kg/m    Physical Exam Vitals and nursing note reviewed.  Constitutional:      Appearance: She is well-developed.  HENT:     Head: Normocephalic and atraumatic.  Cardiovascular:     Rate and Rhythm: Normal rate and regular rhythm.     Heart sounds: Normal heart sounds.  Pulmonary:     Effort: Pulmonary effort is normal.     Breath sounds: Normal breath sounds.  Abdominal:     General: Bowel sounds are normal.     Palpations: Abdomen is soft. There is no mass.     Tenderness: There is no abdominal tenderness.  Skin:    General: Skin is warm and dry.  Neurological:     Mental Status: She is alert and oriented to person, place, and time.  Psychiatric:        Behavior: Behavior normal.     No results found for any visits on 12/21/21.      Assessment & Plan:   Problem List Items Addressed This Visit   None Visit Diagnoses     Diarrhea of presumed infectious origin    -  Primary   Relevant Orders   CBC with Differential/Platelet   COMPLETE METABOLIC PANEL WITH GFR   Lipase   Clostridium difficile Toxin B, Qualitative, Real-Time PCR   Salmonella/Shigella Cult, Campy EIA and Shiga Toxin reflex   Saccharomyces cerevisiae  antibodies, IgG and IgA   Celiac Disease Panel      Diarrhea-she says yesterday she actually had her first normal stool in almost 2 weeks.  As we discussed the option of maybe watching it for another day or 2 to see if it improves on its own. Viral vs bacterial.  That it has not had a chronic pattern yet to be diagnosed as an underlying bowel disease.  No orders of the defined types were placed in this encounter.   No follow-ups on file.  12/23/21, MD

## 2022-07-07 ENCOUNTER — Telehealth: Payer: Self-pay | Admitting: Family Medicine

## 2022-07-07 DIAGNOSIS — Z309 Encounter for contraceptive management, unspecified: Secondary | ICD-10-CM

## 2022-07-07 MED ORDER — DROSPIRENONE-ETHINYL ESTRADIOL 3-0.02 MG PO TABS: 1.0000 | ORAL_TABLET | Freq: Every day | ORAL | 0 refills | Status: DC

## 2022-07-07 NOTE — Telephone Encounter (Signed)
Meds ordered this encounter  Medications   drospirenone-ethinyl estradiol (LORYNA) 3-0.02 MG tablet    Sig: Take 1 tablet by mouth daily.    Dispense:  84 tablet    Refill:  0

## 2022-07-07 NOTE — Telephone Encounter (Signed)
Patient is scheduled for an appointment on 07-27-22 she is requesting refills on [place]

## 2022-07-27 ENCOUNTER — Ambulatory Visit (INDEPENDENT_AMBULATORY_CARE_PROVIDER_SITE_OTHER): Payer: Self-pay | Admitting: Family Medicine

## 2022-07-27 ENCOUNTER — Encounter: Payer: Self-pay | Admitting: Family Medicine

## 2022-07-27 ENCOUNTER — Other Ambulatory Visit (HOSPITAL_COMMUNITY)
Admission: RE | Admit: 2022-07-27 | Discharge: 2022-07-27 | Disposition: A | Discharge status: Home / Self Care | Payer: Self-pay | Source: Ambulatory Visit | Attending: Family Medicine | Admitting: Family Medicine

## 2022-07-27 VITALS — BP 127/67 | HR 89 | Temp 98.2°F | Ht 66.0 in | Wt 133.1 lb

## 2022-07-27 DIAGNOSIS — Z124 Encounter for screening for malignant neoplasm of cervix: Secondary | ICD-10-CM

## 2022-07-27 DIAGNOSIS — Z309 Encounter for contraceptive management, unspecified: Secondary | ICD-10-CM

## 2022-07-27 MED ORDER — DROSPIRENONE-ETHINYL ESTRADIOL 3-0.02 MG PO TABS: 1.0000 | ORAL_TABLET | Freq: Every day | ORAL | 4 refills | Status: DC

## 2022-07-27 NOTE — Progress Notes (Addendum)
   Established Patient Office Visit  Subjective   Patient ID: Betty Larson, female    DOB: Aug 19, 1990  Age: 32 y.o. MRN: [id number]  Chief Complaint  Patient presents with   Gynecologic Exam    HPI Here today for Pap smear as well as refill on her birth control. No pelvic pain or sxs.  Declines STD testing.  She is happy with her current birth control that the last time she filled it was a slightly different generic but she just darted it and so far she is doing well with it.  She does not do self breast exams but does not have any concerning notes today.  Walking 7000-8000 steps per day    ROS    Objective:     BP 127/67 (BP Location: Right Arm, Patient Position: Sitting, Cuff Size: Normal)   Pulse 89   Temp 98.2 F (36.8 C) (Oral)   Ht 5\' 6"  (1.676 m)   Wt 133 lb 1.9 oz (60.4 kg)   LMP 06/22/2022   SpO2 97%   BMI 21.49 kg/m     Physical Exam Vitals reviewed. Exam conducted with a chaperone present.  Constitutional:      Appearance: Normal appearance. She is well-developed.  HENT:     Head: Normocephalic and atraumatic.  Eyes:     Conjunctiva/sclera: Conjunctivae normal.  Pulmonary:     Effort: Pulmonary effort is normal.  Chest:     Chest wall: No mass.  Breasts:    Right: Normal.     Left: Normal.  Genitourinary:    General: Normal vulva.     Vagina: Normal.     Cervix: Normal.     Uterus: Normal.      Adnexa: Right adnexa normal and left adnexa normal.  Lymphadenopathy:     Upper Body:     Right upper body: No axillary or pectoral adenopathy.     Left upper body: No axillary or pectoral adenopathy.  Skin:    General: Skin is dry.     Coloration: Skin is not pale.  Neurological:     Mental Status: She is alert and oriented to person, place, and time.  Psychiatric:        Behavior: Behavior normal.      No results found for any visits on 07/27/22.     The ASCVD Risk score (Arnett DK, et al., 2019) failed to calculate for the following  reasons:   The 2019 ASCVD risk score is only valid for ages 33 to 48    Assessment & Plan:   Problem List Items Addressed This Visit   None Visit Diagnoses     Cervical cancer screening    -  Primary   Relevant Orders   Cytology - PAP   Encounter for contraceptive management, unspecified type       Relevant Medications   drospirenone-ethinyl estradiol (LORYNA) 3-0.02 MG tablet     Happy with current birth control regimen.  Refill sent to the pharmacy for 1 year.  Pap smear performed today will call with results once available no concerning findings on exam.  Normal breast exam today.  I spent 25 minutes on the day of the encounter to include pre-visit record review, face-to-face time with the patient and post visit ordering of test.   No follow-ups on file.    Beatrice Lecher, MD

## 2022-07-29 NOTE — Progress Notes (Signed)
Yes please order  "HPV testing pap with any interpretation".

## 2022-07-29 NOTE — Progress Notes (Signed)
Please call the lab and see if they can add the HPV testing that way if it is negative she can go 5 years instead of 3 years.

## 2022-08-04 LAB — CYTOLOGY - PAP
Comment: NEGATIVE
Diagnosis: NEGATIVE
High risk HPV: NEGATIVE

## 2022-08-04 NOTE — Progress Notes (Signed)
Your Pap smear is normal. You are negative for HPV as well. Repeat pap smear in 5 years.

## 2022-09-30 ENCOUNTER — Encounter: Payer: Self-pay | Admitting: Family Medicine

## 2022-09-30 ENCOUNTER — Ambulatory Visit (INDEPENDENT_AMBULATORY_CARE_PROVIDER_SITE_OTHER): Payer: Self-pay | Admitting: Family Medicine

## 2022-09-30 VITALS — BP 107/77 | HR 83

## 2022-09-30 DIAGNOSIS — R350 Frequency of micturition: Secondary | ICD-10-CM

## 2022-09-30 DIAGNOSIS — N898 Other specified noninflammatory disorders of vagina: Secondary | ICD-10-CM

## 2022-09-30 LAB — WET PREP FOR TRICH, YEAST, CLUE

## 2022-09-30 LAB — POCT URINALYSIS DIP (CLINITEK)
Bilirubin, UA: NEGATIVE
Blood, UA: NEGATIVE
Glucose, UA: NEGATIVE mg/dL
Ketones, POC UA: NEGATIVE mg/dL
Leukocytes, UA: NEGATIVE
Nitrite, UA: NEGATIVE
POC PROTEIN,UA: NEGATIVE
Spec Grav, UA: 1.015 (ref 1.010–1.025)
Urobilinogen, UA: 0.2 E.U./dL
pH, UA: 6.5 (ref 5.0–8.0)

## 2022-09-30 NOTE — Progress Notes (Signed)
   Acute Office Visit  Subjective:     Patient ID: Betty Larson, female    DOB: [birth year], 32 y.o.   MRN: [id number]  Chief Complaint  Patient presents with   Urinary Tract Infection    HPI Patient is in today for 1 week pelvic pressure and discomfort with lower urinary frequency.  No urgency or burning, blood or fever or chills.  It has been coming and going.  Today she actually feels a little bit better.  Symptoms started after intercourse with her husband.  ROS      Objective:    There were no vitals taken for this visit.   Physical Exam Vitals reviewed.  Constitutional:      Appearance: She is well-developed.  HENT:     Head: Normocephalic and atraumatic.  Eyes:     Conjunctiva/sclera: Conjunctivae normal.  Cardiovascular:     Rate and Rhythm: Normal rate.  Pulmonary:     Effort: Pulmonary effort is normal.  Skin:    General: Skin is dry.     Coloration: Skin is not pale.  Neurological:     Mental Status: She is alert and oriented to person, place, and time.  Psychiatric:        Behavior: Behavior normal.     Results for orders placed or performed in visit on 09/30/22  POCT URINALYSIS DIP (CLINITEK)  Result Value Ref Range   Color, UA yellow yellow   Clarity, UA clear clear   Glucose, UA negative negative mg/dL   Bilirubin, UA negative negative   Ketones, POC UA negative negative mg/dL   Spec Grav, UA 4.098 1.191 - 1.025   Blood, UA negative negative   pH, UA 6.5 5.0 - 8.0   POC PROTEIN,UA negative negative, trace   Urobilinogen, UA 0.2 0.2 or 1.0 E.U./dL   Nitrite, UA Negative Negative   Leukocytes, UA Negative Negative        Assessment & Plan:   Problem List Items Addressed This Visit   None Visit Diagnoses     Frequency of urination    -  Primary   Relevant Orders   POCT URINALYSIS DIP (CLINITEK) (Completed)   Urine Culture   Vaginal discharge       Relevant Orders   WET PREP FOR TRICH, YEAST, CLUE      Urinalysis was negative so  we will send for culture.  Wet prep performed as well.  Will call with results as available.  She is actually due to have her period next week.  If symptoms continue to persist we can always consider pelvic ultrasound for further workup.  No orders of the defined types were placed in this encounter.   No follow-ups on file.  Nani Gasser, MD

## 2022-10-01 LAB — WET PREP FOR TRICH, YEAST, CLUE
MICRO NUMBER:: 15049786
Specimen Quality: ADEQUATE

## 2022-10-01 LAB — URINE CULTURE
MICRO NUMBER:: 15049842
Result:: NO GROWTH
SPECIMEN QUALITY:: ADEQUATE

## 2022-10-01 MED ORDER — METRONIDAZOLE 500 MG PO TABS
500.0000 mg | ORAL_TABLET | Freq: Two times a day (BID) | ORAL | 0 refills | Status: DC
Start: 2022-10-01 — End: 2022-10-19

## 2022-10-01 NOTE — Progress Notes (Signed)
Hi Betty Larson, your swab positive for overgrowth of bacteria.will Send in rx for metronidazole antibiotic to clear that up.

## 2022-10-01 NOTE — Addendum Note (Signed)
Addended by: Nani Gasser D on: 10/01/2022 09:31 AM   Modules accepted: Orders

## 2022-10-02 NOTE — Progress Notes (Signed)
YOur final urine culture is negative.

## 2022-10-10 ENCOUNTER — Encounter: Payer: Self-pay | Admitting: Family Medicine

## 2022-10-11 ENCOUNTER — Ambulatory Visit (INDEPENDENT_AMBULATORY_CARE_PROVIDER_SITE_OTHER): Payer: Self-pay | Admitting: Family Medicine

## 2022-10-11 VITALS — BP 118/75 | HR 79 | Resp 18 | Ht 66.0 in | Wt 132.0 lb

## 2022-10-11 DIAGNOSIS — N898 Other specified noninflammatory disorders of vagina: Secondary | ICD-10-CM | POA: Insufficient documentation

## 2022-10-11 DIAGNOSIS — R350 Frequency of micturition: Secondary | ICD-10-CM

## 2022-10-11 LAB — POCT URINALYSIS DIP (CLINITEK)
Bilirubin, UA: NEGATIVE
Blood, UA: NEGATIVE
Glucose, UA: NEGATIVE mg/dL
Ketones, POC UA: NEGATIVE mg/dL
Leukocytes, UA: NEGATIVE
Nitrite, UA: NEGATIVE
POC PROTEIN,UA: NEGATIVE
Spec Grav, UA: 1.025 (ref 1.010–1.025)
Urobilinogen, UA: 0.2 E.U./dL
pH, UA: 6 (ref 5.0–8.0)

## 2022-10-11 LAB — WET PREP FOR TRICH, YEAST, CLUE
MICRO NUMBER:: 15090412
Specimen Quality: ADEQUATE

## 2022-10-11 NOTE — Progress Notes (Signed)
Acute Office Visit  Subjective:     Patient ID: Betty Larson, female    DOB: 1990-08-31, 32 y.o.   MRN: [id number]  Chief Complaint  Patient presents with   Urinary Frequency    Pt has vaginal discharge and frequent urination x1wk    HPI Patient is in today for frequent urination and concerns for BV. She has a history of bv two weeks ago and is still having vaginal discharge. She feels like the symptoms never went away after completion of abx. Denies hematuria, back pain, burning with urination.  Review of Systems  Constitutional:  Negative for chills and fever.  Respiratory:  Negative for cough and shortness of breath.   Cardiovascular:  Negative for chest pain.  Genitourinary:  Positive for frequency. Negative for flank pain, hematuria and urgency.  Neurological:  Negative for headaches.        Objective:    BP 118/75 (BP Location: Left Arm, Patient Position: Sitting, Cuff Size: Normal)   Pulse 79   Resp 18   Ht 5\' 6"  (1.676 m)   Wt 132 lb (59.9 kg)   BMI 21.31 kg/m    Physical Exam Vitals and nursing note reviewed.  Constitutional:      General: She is not in acute distress.    Appearance: Normal appearance.  HENT:     Head: Normocephalic and atraumatic.     Right Ear: External ear normal.     Left Ear: External ear normal.     Nose: Nose normal.  Eyes:     Conjunctiva/sclera: Conjunctivae normal.  Cardiovascular:     Rate and Rhythm: Normal rate.  Pulmonary:     Effort: Pulmonary effort is normal.  Neurological:     General: No focal deficit present.     Mental Status: She is alert and oriented to person, place, and time.  Psychiatric:        Mood and Affect: Mood normal.        Behavior: Behavior normal.        Thought Content: Thought content normal.        Judgment: Judgment normal.     Results for orders placed or performed in visit on 10/11/22  POCT URINALYSIS DIP (CLINITEK)  Result Value Ref Range   Color, UA yellow yellow   Clarity, UA  clear clear   Glucose, UA negative negative mg/dL   Bilirubin, UA negative negative   Ketones, POC UA negative negative mg/dL   Spec Grav, UA 2.595 6.387 - 1.025   Blood, UA negative negative   pH, UA 6.0 5.0 - 8.0   POC PROTEIN,UA negative negative, trace   Urobilinogen, UA 0.2 0.2 or 1.0 E.U./dL   Nitrite, UA Negative Negative   Leukocytes, UA Negative Negative        Assessment & Plan:   Problem List Items Addressed This Visit       Other   Frequency of urination - Primary    - UA ordered and interpreted by myself as negative for infection. Will culture      Relevant Orders   Urine Culture   WET PREP FOR TRICH, YEAST, CLUE   POCT URINALYSIS DIP (CLINITEK) (Completed)   Vaginal discharge    Pt has hx of BV two weeks ago and was given metronidazole. Completed abx but is still not feeling better. Have gone ahead and had patient self swab. Sent out stat wet prep and will treat accordingly. Pt is aware we will wait for results  to treat      Relevant Orders   WET PREP FOR TRICH, YEAST, CLUE    No orders of the defined types were placed in this encounter.   No follow-ups on file.  Charlton Amor, DO

## 2022-10-11 NOTE — Assessment & Plan Note (Signed)
-   UA ordered and interpreted by myself as negative for infection. Will culture

## 2022-10-11 NOTE — Assessment & Plan Note (Addendum)
Pt has hx of BV two weeks ago and was given metronidazole. Completed abx but is still not feeling better. Have gone ahead and had patient self swab. Sent out stat wet prep and will treat accordingly. Pt is aware we will wait for results to treat

## 2022-10-12 LAB — URINE CULTURE
MICRO NUMBER:: 15090871
Result:: NO GROWTH
SPECIMEN QUALITY:: ADEQUATE

## 2022-10-19 ENCOUNTER — Encounter: Payer: Self-pay | Admitting: Family Medicine

## 2022-10-19 ENCOUNTER — Ambulatory Visit (INDEPENDENT_AMBULATORY_CARE_PROVIDER_SITE_OTHER): Payer: Self-pay | Admitting: Family Medicine

## 2022-10-19 VITALS — BP 130/77 | HR 96 | Resp 20 | Ht 66.0 in | Wt 130.8 lb

## 2022-10-19 DIAGNOSIS — B379 Candidiasis, unspecified: Secondary | ICD-10-CM

## 2022-10-19 DIAGNOSIS — N760 Acute vaginitis: Secondary | ICD-10-CM

## 2022-10-19 DIAGNOSIS — R300 Dysuria: Secondary | ICD-10-CM

## 2022-10-19 DIAGNOSIS — B9689 Other specified bacterial agents as the cause of diseases classified elsewhere: Secondary | ICD-10-CM

## 2022-10-19 DIAGNOSIS — R339 Retention of urine, unspecified: Secondary | ICD-10-CM

## 2022-10-19 DIAGNOSIS — N898 Other specified noninflammatory disorders of vagina: Secondary | ICD-10-CM

## 2022-10-19 LAB — POCT URINALYSIS DIP (CLINITEK)
Bilirubin, UA: NEGATIVE
Glucose, UA: NEGATIVE mg/dL
Ketones, POC UA: NEGATIVE mg/dL
Leukocytes, UA: NEGATIVE
Nitrite, UA: NEGATIVE
POC PROTEIN,UA: NEGATIVE
Spec Grav, UA: 1.01
Urobilinogen, UA: 0.2 U/dL
pH, UA: 7

## 2022-10-19 LAB — WET PREP FOR TRICH, YEAST, CLUE
MICRO NUMBER:: 15124523
Specimen Quality: ADEQUATE

## 2022-10-19 MED ORDER — METRONIDAZOLE 500 MG PO TABS
500.0000 mg | ORAL_TABLET | Freq: Two times a day (BID) | ORAL | 0 refills | Status: AC
Start: 2022-10-19 — End: 2022-10-26

## 2022-10-19 MED ORDER — FLUCONAZOLE 150 MG PO TABS
150.0000 mg | ORAL_TABLET | Freq: Once | ORAL | 0 refills | Status: AC
Start: 2022-10-19 — End: 2022-10-19

## 2022-10-19 MED ORDER — CEPHALEXIN 500 MG PO CAPS: 500.0000 mg | ORAL_CAPSULE | Freq: Two times a day (BID) | ORAL | 0 refills | Status: AC

## 2022-10-19 MED ORDER — TAMSULOSIN HCL 0.4 MG PO CAPS
0.4000 mg | ORAL_CAPSULE | Freq: Every day | ORAL | 3 refills | Status: AC
Start: 2022-10-19 — End: ?

## 2022-10-19 NOTE — Assessment & Plan Note (Signed)
Pt has hx of bv have gone ahead and done a wet prep

## 2022-10-19 NOTE — Progress Notes (Addendum)
Acute Office Visit  Subjective:     Patient ID: Betty Larson, female    DOB: 1991-01-08, 32 y.o.   MRN: [id number]  Chief Complaint  Patient presents with   Urinary Tract Infection    Pt coming in to be checked for a uti    Urinary Tract Infection  Pertinent negatives include no chills.   Patient is in today for dysuria.  Review of Systems  Constitutional:  Negative for chills and fever.  Respiratory:  Negative for cough and shortness of breath.   Cardiovascular:  Negative for chest pain.  Neurological:  Negative for headaches.        Objective:    BP 130/77 (BP Location: Left Arm, Patient Position: Sitting, Cuff Size: Normal)   Pulse 96   Resp 20   Ht 5\' 6"  (1.676 m)   Wt 130 lb 12 oz (59.3 kg)   SpO2 100%   BMI 21.10 kg/m    Physical Exam Vitals and nursing note reviewed.  Constitutional:      General: She is not in acute distress.    Appearance: Normal appearance.  HENT:     Head: Normocephalic and atraumatic.     Right Ear: External ear normal.     Left Ear: External ear normal.     Nose: Nose normal.  Eyes:     Conjunctiva/sclera: Conjunctivae normal.  Cardiovascular:     Rate and Rhythm: Normal rate and regular rhythm.  Pulmonary:     Effort: Pulmonary effort is normal.     Breath sounds: Normal breath sounds.  Neurological:     General: No focal deficit present.     Mental Status: She is alert and oriented to person, place, and time.  Psychiatric:        Mood and Affect: Mood normal.        Behavior: Behavior normal.        Thought Content: Thought content normal.        Judgment: Judgment normal.     Results for orders placed or performed in visit on 10/19/22  POCT URINALYSIS DIP (CLINITEK)  Result Value Ref Range   Color, UA yellow yellow   Clarity, UA clear clear   Glucose, UA negative negative mg/dL   Bilirubin, UA negative negative   Ketones, POC UA negative negative mg/dL   Spec Grav, UA 4.098 1.191 - 1.025   Blood, UA  trace-lysed (A) negative   pH, UA 7.0 5.0 - 8.0   POC PROTEIN,UA negative negative, trace   Urobilinogen, UA 0.2 0.2 or 1.0 E.U./dL   Nitrite, UA Negative Negative   Leukocytes, UA Negative Negative        Assessment & Plan:   Problem List Items Addressed This Visit       Other   Dysuria - Primary    - poc ua done - pt still notes an increase in pain and pressure. Will go ahead and treat with keflex, diflucan given for yeast infection. I wonder if it is too early to detect infection today - will culture urine  - if pain isn't relieved by completion of abx we may need to do a TVUS to see what can explain this or do another referral to urology - blood seen on UA pt is just finishing period and had intercourse two days ago so I am wondering if this is from that vs sign of infection. We will monitor and repeat as needed      Relevant Orders  WET PREP FOR TRICH, YEAST, CLUE   Incomplete emptying of bladder    - was seen by urology in 2021 will go ahead and restart pt on recommended tamsulosin. Discussed side effects of tamsulosin being an alpha blocker with blood pressure - recommended using d-mannose and cranberry supplement (which patient is taking)  - recommended increasing water intake      Relevant Medications   tamsulosin (FLOMAX) 0.4 MG CAPS capsule   Other Relevant Orders   POCT URINALYSIS DIP (CLINITEK) (Completed)   Vaginal discharge    Pt has hx of bv have gone ahead and done a wet prep       Other Visit Diagnoses     Yeast infection       Relevant Medications   cephALEXin (KEFLEX) 500 MG capsule   fluconazole (DIFLUCAN) 150 MG tablet       ADDENDUM: BV infection noted on wet prep - sent in mtz and counseled pt on no alcohol with this - if BV recurs we will likely need to do boric acid suppository  Meds ordered this encounter  Medications   tamsulosin (FLOMAX) 0.4 MG CAPS capsule    Sig: Take 1 capsule (0.4 mg total) by mouth daily.    Dispense:  30  capsule    Refill:  3   cephALEXin (KEFLEX) 500 MG capsule    Sig: Take 1 capsule (500 mg total) by mouth 2 (two) times daily for 5 days.    Dispense:  10 capsule    Refill:  0   fluconazole (DIFLUCAN) 150 MG tablet    Sig: Take 1 tablet (150 mg total) by mouth once for 1 dose.    Dispense:  1 tablet    Refill:  0    Return if symptoms worsen or fail to improve.  Charlton Amor, DO

## 2022-10-19 NOTE — Addendum Note (Signed)
Addended by: Charlton Amor on: 10/19/2022 04:31 PM   Modules accepted: Orders

## 2022-10-19 NOTE — Assessment & Plan Note (Addendum)
-   was seen by urology in 2021 will go ahead and restart pt on recommended tamsulosin. Discussed side effects of tamsulosin being an alpha blocker with blood pressure - recommended using d-mannose and cranberry supplement (which patient is taking)  - recommended increasing water intake

## 2022-10-19 NOTE — Assessment & Plan Note (Addendum)
-   poc ua done - pt still notes an increase in pain and pressure. Will go ahead and treat with keflex, diflucan given for yeast infection. I wonder if it is too early to detect infection today - will culture urine  - if pain isn't relieved by completion of abx we may need to do a TVUS to see what can explain this or do another referral to urology - blood seen on UA pt is just finishing period and had intercourse two days ago so I am wondering if this is from that vs sign of infection. We will monitor and repeat as needed

## 2022-10-19 NOTE — Patient Instructions (Addendum)
Start taking Flomax daily for incomplete emptying  Start cranberry with D-Mannose Start using water based lubricant during intercourse

## 2023-08-09 ENCOUNTER — Other Ambulatory Visit: Payer: Self-pay | Admitting: Family Medicine

## 2023-08-09 DIAGNOSIS — Z309 Encounter for contraceptive management, unspecified: Secondary | ICD-10-CM

## 2023-11-22 ENCOUNTER — Other Ambulatory Visit: Payer: Self-pay | Admitting: Family Medicine

## 2023-11-22 DIAGNOSIS — Z309 Encounter for contraceptive management, unspecified: Secondary | ICD-10-CM

## 2024-01-10 ENCOUNTER — Ambulatory Visit: Payer: Self-pay | Admitting: Medical-Surgical

## 2024-01-10 ENCOUNTER — Ambulatory Visit
Admission: RE | Admit: 2024-01-10 | Discharge: 2024-01-10 | Disposition: A | Discharge status: Home / Self Care | Payer: Self-pay | Source: Ambulatory Visit | Attending: Family Medicine | Admitting: Family Medicine

## 2024-01-10 VITALS — BP 122/81 | HR 90 | Temp 98.7°F | Resp 18 | Ht 66.0 in | Wt 130.0 lb

## 2024-01-10 DIAGNOSIS — N898 Other specified noninflammatory disorders of vagina: Secondary | ICD-10-CM | POA: Insufficient documentation

## 2024-01-10 MED ORDER — METRONIDAZOLE 500 MG PO TABS: 500.0000 mg | ORAL_TABLET | Freq: Two times a day (BID) | ORAL | 0 refills | Status: AC

## 2024-01-10 NOTE — Discharge Instructions (Addendum)
 Advised patient we will follow-up with Aptima swab results once received.  Advised if BV is positive please take Flagyl  with food to completion.  Encouraged to increase daily water intake to 64 ounces per day when taking this medication.  Advised if symptoms worsen and/or unresolved please follow-up with your PCP, GYN, or here for further evaluation.

## 2024-01-10 NOTE — ED Provider Notes (Signed)
 [facility]    CSN: [id number] Arrival date & time: 01/10/24  1301      History   Chief Complaint Chief Complaint  Patient presents with   Urinary Frequency    Could possibly have BV - Entered by patient    HPI Cleda Imel is a 33 y.o. female.   HPI Pleasant 33 year old female presents with possible BV.  Reports no vaginal discharge but irritation and request for BV testing.  PMH significant for vaginal discharge, dysuria, and symptomatic PVCs.  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      Home Medications    Prior to Admission medications   Medication Sig Start Date End Date Taking? Authorizing Provider  drospirenone -ethinyl estradiol  (YAZ) 3-0.02 MG tablet TAKE ONE TABLET BY MOUTH ONE TIME DAILY 11/23/23  Yes Alvan Dorothyann BIRCH, MD  metroNIDAZOLE  (FLAGYL ) 500 MG tablet Take 1 tablet (500 mg total) by mouth 2 (two) times daily. 01/10/24  Yes Teddy Sharper, FNP  Multiple Vitamins-Iron (MULTIVITAMIN PLUS IRON ADULT) TABS Take 1 tablet by mouth daily.   Yes [provider]  tamsulosin  (FLOMAX ) 0.4 MG CAPS capsule Take 1 capsule (0.4 mg total) by mouth daily. 10/19/22   Bevin Bernice RAMAN, DO    Family History Family History  Problem Relation Age of Onset   Breast cancer Maternal Grandmother        breast   Diabetes Maternal Grandfather     Social History Social History   Tobacco Use   Smoking status: Never   Smokeless tobacco: Never  Vaping Use   Vaping status: Never Used  Substance Use Topics    Alcohol use: Yes    Comment: occ   Drug use: No     Allergies   Patient has no known allergies.   Review of Systems Review of Systems  Genitourinary:  Positive for frequency.     Physical Exam Triage Vital Signs ED Triage Vitals [01/10/24 1315]  Encounter Vitals Group     BP      Girls Systolic BP Percentile      Girls Diastolic BP Percentile      Boys Systolic BP Percentile      Boys Diastolic BP Percentile      Pulse      Resp      Temp      Temp src      SpO2      Weight      Height      Head Circumference      Peak Flow      Pain Score 0     Pain Loc      Pain Education      Exclude from Growth Chart    No data found.  Updated Vital Signs BP 122/81 (BP Location: Right Arm)   Pulse 90   Temp 98.7 F (37.1 C) (Oral)   Resp 18   Ht 5' 6 (1.676 m)   Wt 130 lb (59 kg)   LMP 01/03/2024  SpO2 97%   BMI 20.98 kg/m    Physical Exam Vitals and nursing note reviewed.  Constitutional:      General: She is not in acute distress.    Appearance: Normal appearance. She is normal weight. She is not ill-appearing.  HENT:     Head: Normocephalic and atraumatic.     Mouth/Throat:     Mouth: Mucous membranes are moist.     Pharynx: Oropharynx is clear.  Eyes:     Extraocular Movements: Extraocular movements intact.     Pupils: Pupils are equal, round, and reactive to light.  Cardiovascular:     Rate and Rhythm: Normal rate and regular rhythm.     Pulses: Normal pulses.     Heart sounds: Normal heart sounds.  Pulmonary:     Effort: Pulmonary effort is normal.     Breath sounds: Normal breath sounds. No wheezing, rhonchi or rales.  Musculoskeletal:        General: Normal range of motion.     Cervical back: Normal range of motion and neck supple.  Skin:    General: Skin is warm and dry.  Neurological:     General: No focal deficit present.     Mental Status: She is alert and oriented to person, place, and time. Mental status is at baseline.   Psychiatric:        Mood and Affect: Mood normal.        Behavior: Behavior normal.      UC Treatments / Results  Labs (all labs ordered are listed, but only abnormal results are displayed) Labs Reviewed  CERVICOVAGINAL ANCILLARY ONLY    EKG   Radiology No results found.  Procedures Procedures (including critical care time)  Medications Ordered in UC Medications - No data to display  Initial Impression / Assessment and Plan / UC Course  I have reviewed the triage vital signs and the nursing notes.  Pertinent labs & imaging results that were available during my care of the patient were reviewed by me and considered in my medical decision making (see chart for details).     MDM: 1.  Vaginal irritation-Aptima swab ordered for BV only per patient's request.  Rx'd Flagyl  500 mg tablet: Take 1 tablet twice daily x 7 days in case Aptima swab was positive. Advised patient we will follow-up with Aptima swab results once received.  Advised if BV is positive please take Flagyl  with food to completion.  Encouraged to increase daily water intake to 64 ounces per day when taking this medication.  Advised if symptoms worsen and/or unresolved please follow-up with your PCP, GYN, or here for further evaluation.  Final Clinical Impressions(s) / UC Diagnoses   Final diagnoses:  Vaginal irritation     Discharge Instructions      Advised patient we will follow-up with Aptima swab results once received.  Advised if BV is positive please take Flagyl  with food to completion.  Encouraged to increase daily water intake to 64 ounces per day when taking this medication.  Advised if symptoms worsen and/or unresolved please follow-up with your PCP, GYN, or here for further evaluation.     ED Prescriptions     Medication Sig Dispense Auth. Provider   metroNIDAZOLE  (FLAGYL ) 500 MG tablet Take 1 tablet (500 mg total) by mouth 2 (two) times daily. 14 tablet Issaih Kaus, FNP      PDMP not  reviewed this encounter.   Teddy Sharper, FNP 01/10/24 1348

## 2024-01-10 NOTE — ED Triage Notes (Addendum)
 Patient c/o possible BV or UTI.  Noticed yesterday having a lot of irritation, no urinary sx's.  Denies any vaginal discharge.  Does not want any STI or UA testing just BV.

## 2024-01-11 ENCOUNTER — Ambulatory Visit: Payer: Self-pay | Admitting: Family Medicine

## 2024-01-11 LAB — CERVICOVAGINAL ANCILLARY ONLY
Bacterial Vaginitis (gardnerella): NEGATIVE
Comment: NEGATIVE

## 2024-02-21 ENCOUNTER — Other Ambulatory Visit: Payer: Self-pay | Admitting: Family Medicine

## 2024-02-21 DIAGNOSIS — Z309 Encounter for contraceptive management, unspecified: Secondary | ICD-10-CM

## 2024-05-14 ENCOUNTER — Other Ambulatory Visit: Payer: Self-pay | Admitting: Family Medicine

## 2024-05-14 DIAGNOSIS — Z309 Encounter for contraceptive management, unspecified: Secondary | ICD-10-CM
